# Patient Record
Sex: Female | Born: 1960 | Race: White | Hispanic: No | Marital: Married | Smoking: Never smoker
Health system: Southern US, Community
[De-identification: ages and names within clinical notes are randomized; demographics above are authoritative.]

## PROBLEM LIST (undated history)

## (undated) HISTORY — PX: WISDOM TOOTH EXTRACTION: SHX21

## (undated) HISTORY — PX: BREAST SURGERY: SHX581

---

## 2018-10-16 ENCOUNTER — Other Ambulatory Visit: Payer: Self-pay | Admitting: Internal Medicine

## 2018-10-16 DIAGNOSIS — R1013 Epigastric pain: Secondary | ICD-10-CM

## 2018-10-16 DIAGNOSIS — D72829 Elevated white blood cell count, unspecified: Secondary | ICD-10-CM

## 2018-10-17 ENCOUNTER — Emergency Department (HOSPITAL_COMMUNITY)
Admission: EM | Admit: 2018-10-17 | Discharge: 2018-10-17 | Disposition: A | Discharge status: Home / Self Care | Payer: 59 | Attending: Emergency Medicine | Admitting: Emergency Medicine

## 2018-10-17 ENCOUNTER — Other Ambulatory Visit: Payer: Self-pay

## 2018-10-17 ENCOUNTER — Ambulatory Visit
Admission: RE | Admit: 2018-10-17 | Discharge: 2018-10-17 | Disposition: A | Discharge status: Home / Self Care | Payer: Self-pay | Source: Ambulatory Visit | Attending: Internal Medicine | Admitting: Internal Medicine

## 2018-10-17 ENCOUNTER — Encounter (HOSPITAL_COMMUNITY): Payer: Self-pay

## 2018-10-17 ENCOUNTER — Emergency Department (HOSPITAL_COMMUNITY): Payer: 59

## 2018-10-17 DIAGNOSIS — R52 Pain, unspecified: Secondary | ICD-10-CM

## 2018-10-17 DIAGNOSIS — K8020 Calculus of gallbladder without cholecystitis without obstruction: Secondary | ICD-10-CM | POA: Insufficient documentation

## 2018-10-17 DIAGNOSIS — R1013 Epigastric pain: Secondary | ICD-10-CM | POA: Diagnosis present

## 2018-10-17 DIAGNOSIS — D72829 Elevated white blood cell count, unspecified: Secondary | ICD-10-CM

## 2018-10-17 LAB — COMPREHENSIVE METABOLIC PANEL
ALT: 22 U/L (ref 0–44)
AST: 30 U/L (ref 15–41)
Albumin: 4 g/dL (ref 3.5–5.0)
Alkaline Phosphatase: 62 U/L (ref 38–126)
Anion gap: 12 (ref 5–15)
BUN: 9 mg/dL (ref 6–20)
CO2: 25 mmol/L (ref 22–32)
Calcium: 9.5 mg/dL (ref 8.9–10.3)
Chloride: 100 mmol/L (ref 98–111)
Creatinine, Ser: 0.77 mg/dL (ref 0.44–1.00)
GFR calc Af Amer: >60 mL/min (ref >60)
GFR calc non Af Amer: >60 mL/min (ref >60)
Glucose, Bld: 107 mg/dL — ABNORMAL HIGH (ref 70–99)
Potassium: 4 mmol/L (ref 3.5–5.1)
Sodium: 137 mmol/L (ref 135–145)
Total Bilirubin: 0.9 mg/dL (ref 0.3–1.2)
Total Protein: 7.4 g/dL (ref 6.5–8.1)

## 2018-10-17 LAB — LIPASE, BLOOD: Lipase: 20 U/L (ref 11–51)

## 2018-10-17 LAB — CBC
HCT: 38.7 % (ref 36.0–46.0)
Hemoglobin: 13 g/dL (ref 12.0–15.0)
MCH: 29.1 pg (ref 26.0–34.0)
MCHC: 33.6 g/dL (ref 30.0–36.0)
MCV: 86.8 fL (ref 80.0–100.0)
Platelets: 313 10*3/uL (ref 150–400)
RBC: 4.46 MIL/uL (ref 3.87–5.11)
RDW: 12.7 % (ref 11.5–15.5)
WBC: 12.4 10*3/uL — ABNORMAL HIGH (ref 4.0–10.5)
nRBC: 0 % (ref 0.0–0.2)

## 2018-10-17 IMAGING — CT CT ABDOMEN AND PELVIS WITH CONTRAST
1 of 3 series · 14 of 32 positions shown, 19 images · IV contrast (APPLIED)
Comparison: None.

CLINICAL DATA: Right upper quadrant pain, epigastric pain and
elevated white blood cell count.

EXAM:
CT ABDOMEN AND PELVIS WITH CONTRAST
TECHNIQUE: Multidetector CT imaging of the abdomen and pelvis was performed
using the standard protocol following bolus administration of
intravenous contrast.
CONTRAST:  100mL M1HM1W-BII IOPAMIDOL (M1HM1W-BII) INJECTION 61%

[Series 2: abd/pelvis w/cm · axial · 0.80mm/px · z∈[-447,-27]mm · 14 of 96 slices shown, 19 images]
[im 6/96  soft-tissue]
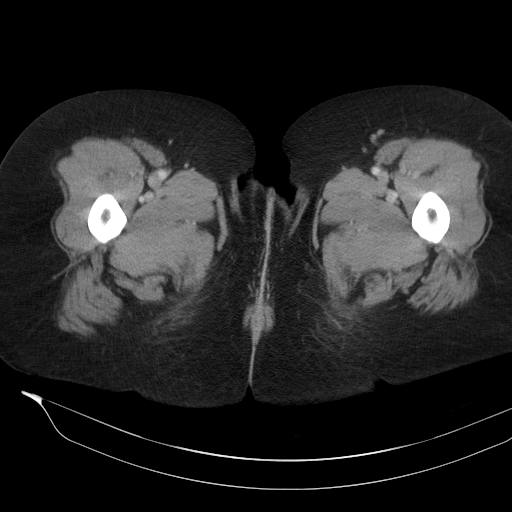
[im 6/96  bone]
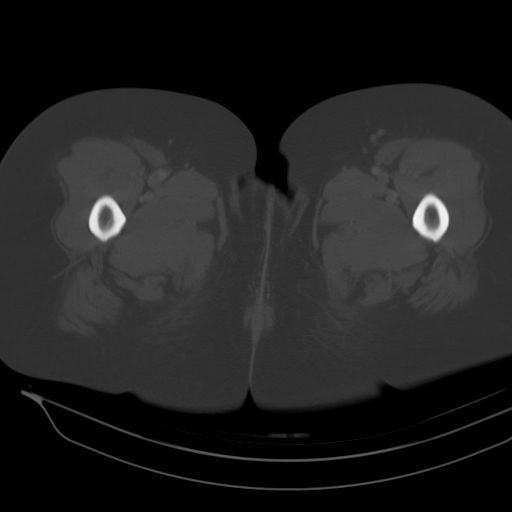
[im 12/96  soft-tissue]
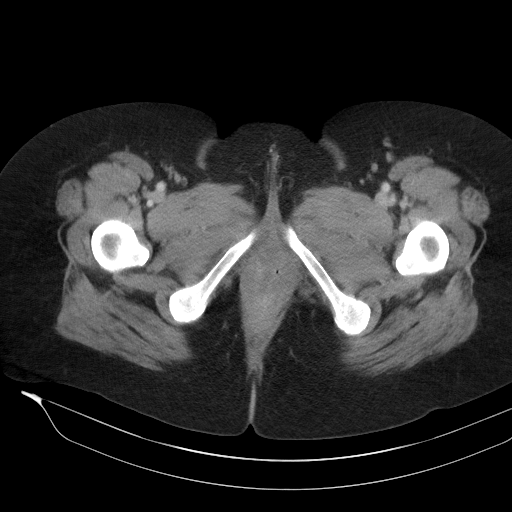
[im 18/96  soft-tissue]
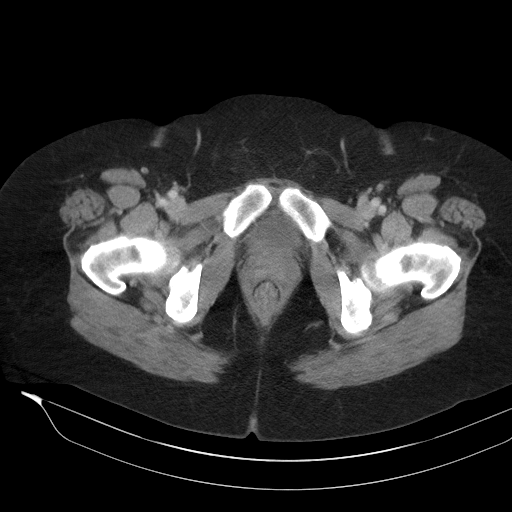
[im 30/96  soft-tissue]
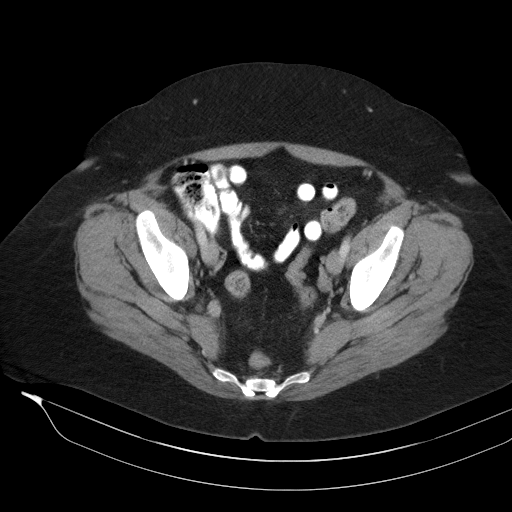
[im 36/96  soft-tissue]
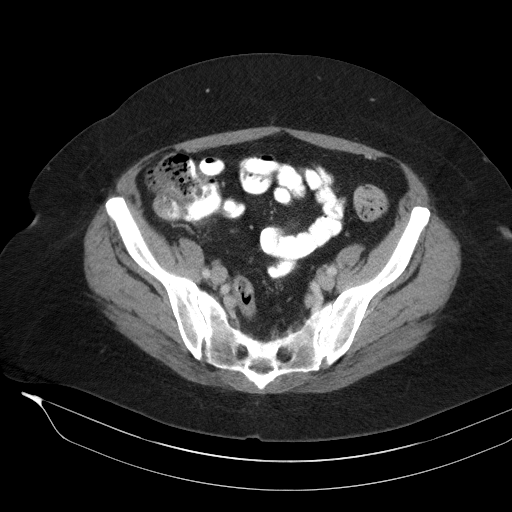
[im 42/96  soft-tissue]
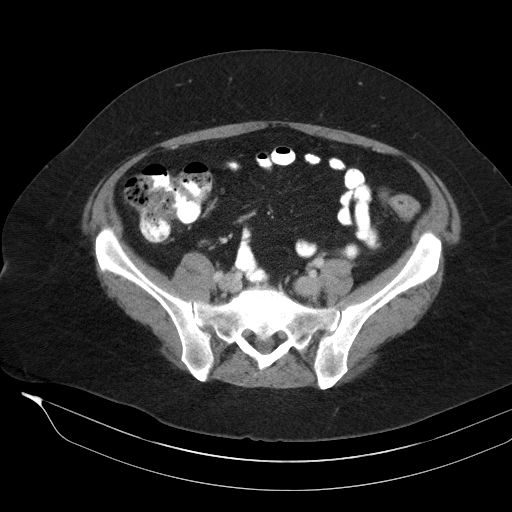
[im 48/96  soft-tissue]
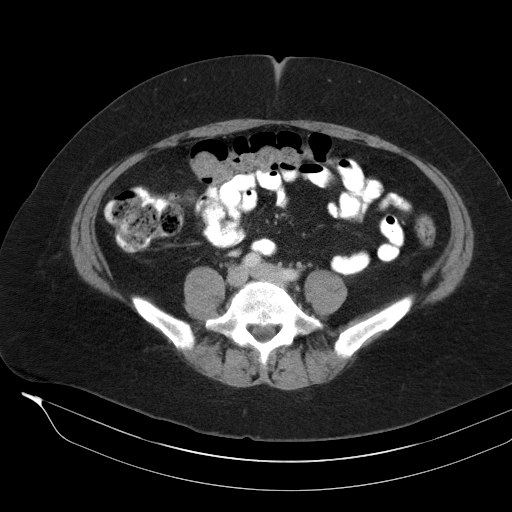
[im 54/96  soft-tissue]
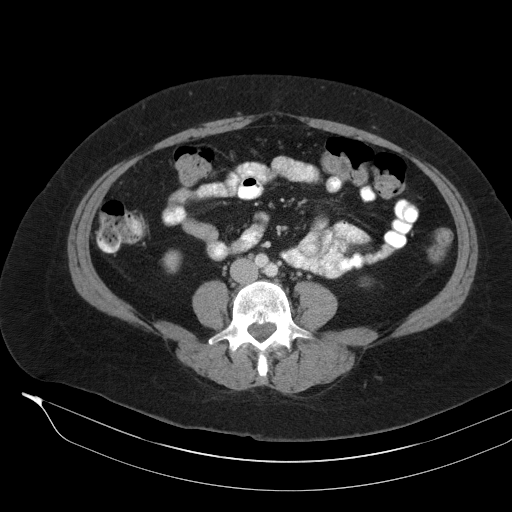
[im 60/96  soft-tissue]
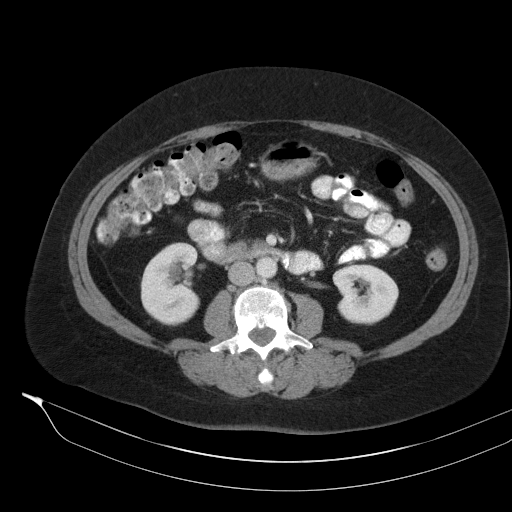
[im 60/96  bone]
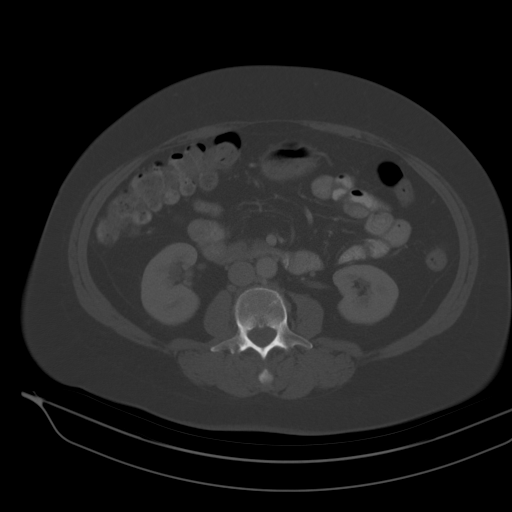
[im 66/96  soft-tissue]
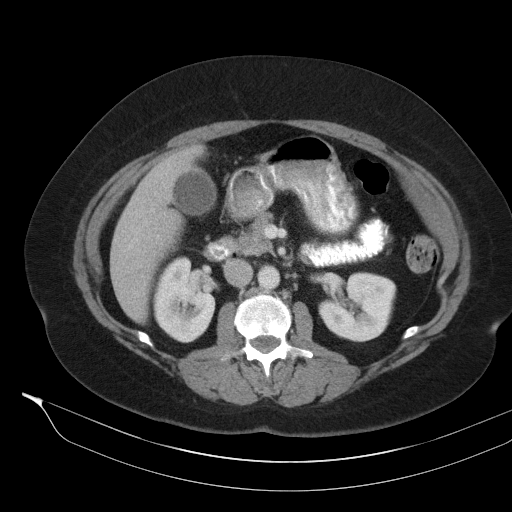
[im 72/96  lung]
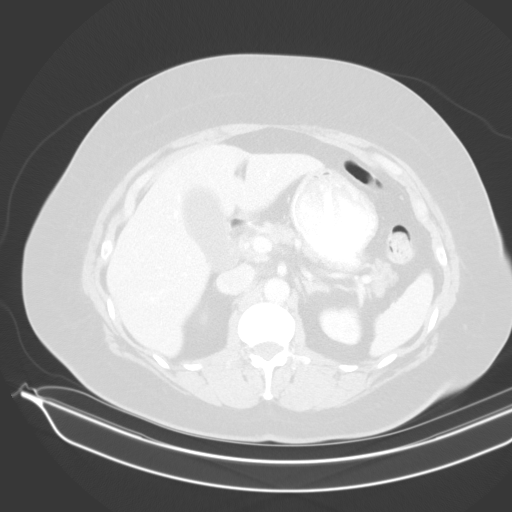
[im 78/96  soft-tissue]
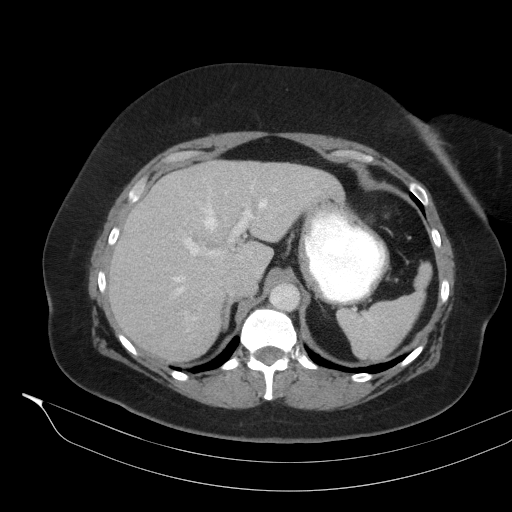
[im 78/96  lung]
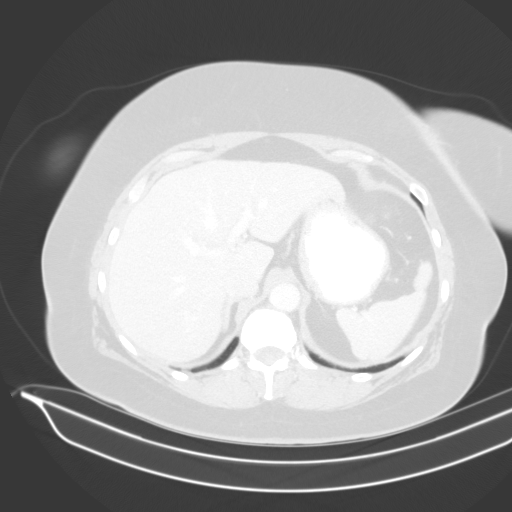
[im 84/96  soft-tissue]
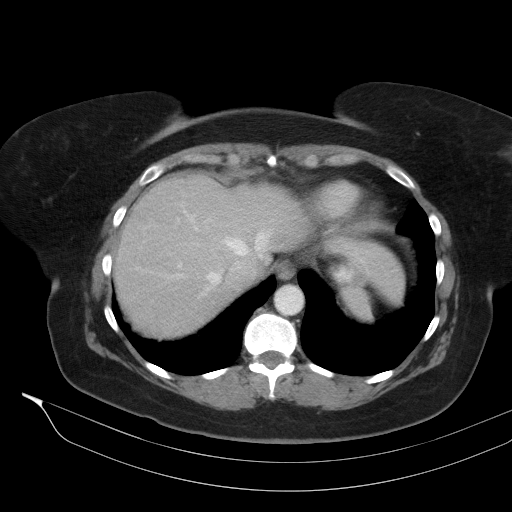
[im 84/96  lung]
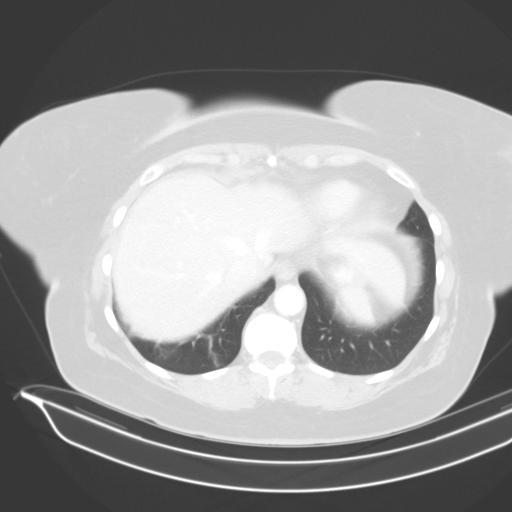
[im 90/96  soft-tissue]
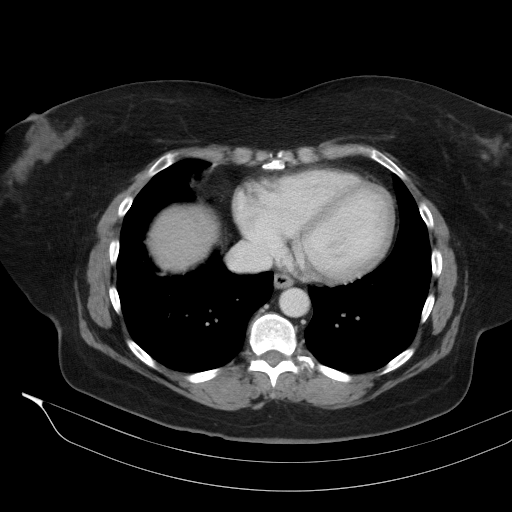
[im 90/96  lung]
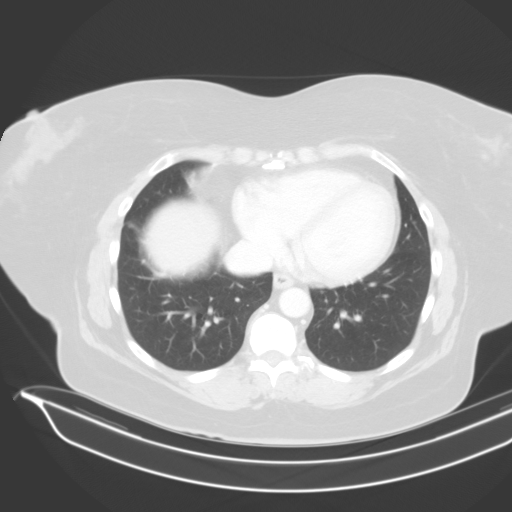

[14 of 32 positions shown; findings below may reference images not displayed]

FINDINGS: Lower chest: Right pericardial cyst. Partially visualized. No acute
abnormalities. Mild atelectatic changes of bilateral lung bases.

Hepatobiliary: Normal appearance of the liver. Apparent thickening
of the gallbladder wall. No evidence of biliary ductal dilation.

Pancreas: Unremarkable. No pancreatic ductal dilatation or
surrounding inflammatory changes.

Spleen: Normal in size without focal abnormality.

Adrenals/Urinary Tract: 1.4 cm intermediate density right adrenal
mass. Normal left adrenal. Normal appearance of the kidneys, ureters
and urinary bladder.

Stomach/Bowel: Stomach is within normal limits. No evidence of
appendicitis. No evidence of bowel wall thickening, distention, or
inflammatory changes.

Vascular/Lymphatic: No significant vascular findings are present. No
enlarged abdominal or pelvic lymph nodes.

Reproductive: Uterus and bilateral adnexa are unremarkable.

Other: No abdominal wall hernia or abnormality. No abdominopelvic
ascites.

Musculoskeletal: Mild spondylosis of the lower lumbosacral spine.
IMPRESSION: 1. Apparent thickening of the gallbladder wall. Please correlate to
patient's symptomatology and liver enzymes to assess for possible
acute cholecystitis.
2. 1.4 cm intermediate density right adrenal mass. This may
represent an adrenal adenoma, however is incompletely characterized
on this single phase study.
3. No evidence of acute abnormalities within the solid abdominal
organs.
4. Right pericardial cyst.
5. Mild spondylosis of the lower lumbosacral spine.

## 2018-10-17 IMAGING — US ULTRASOUND ABDOMEN LIMITED
1 series · 14 of 21 positions shown · non-contrast
Comparison: Abdomen CT obtained earlier today.

CLINICAL DATA: Gallbladder wall thickening on an abdomen CT earlier
today. Right upper quadrant and epigastric abdominal pain and
leukocytosis.

EXAM:
ULTRASOUND ABDOMEN LIMITED RIGHT UPPER QUADRANT

[Series 1: ultrasound abdomen limited · 14 of 21 slices shown]
[im 1/21]
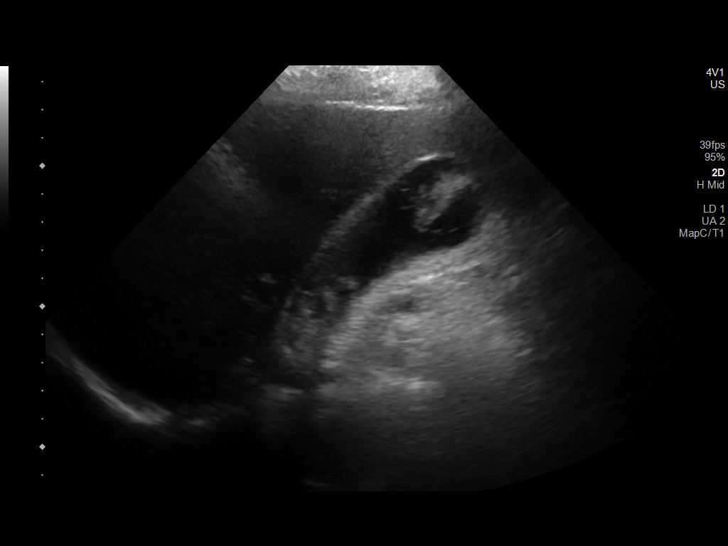
[im 3/21]
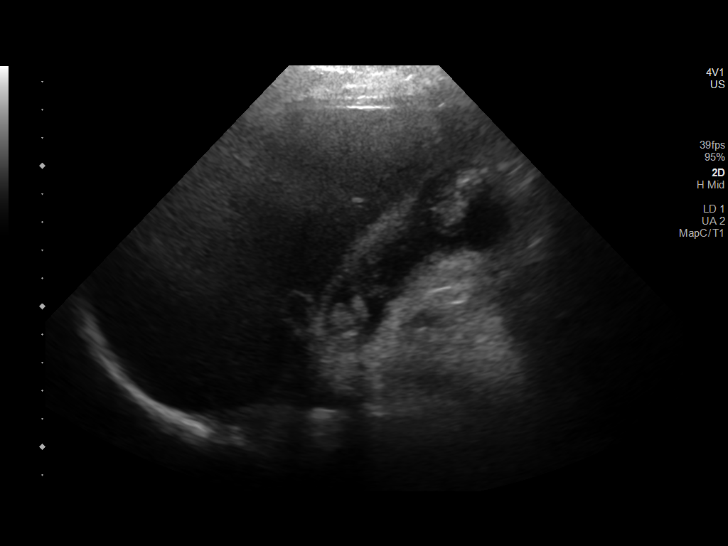
[im 4/21]
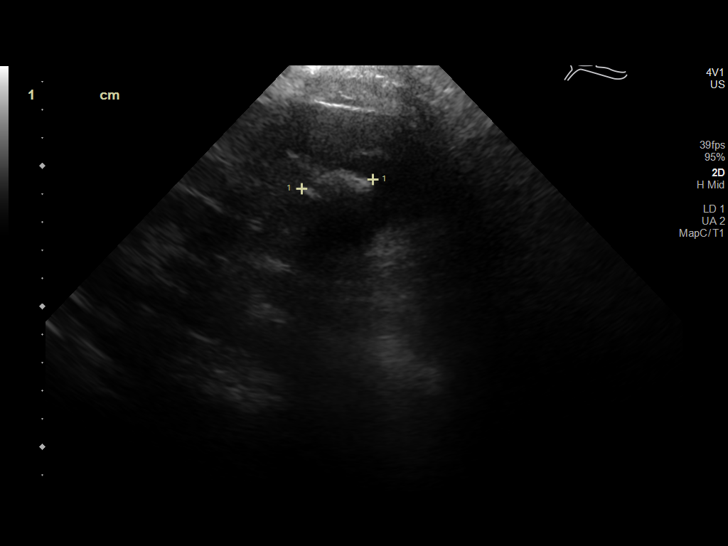
[im 6/21]
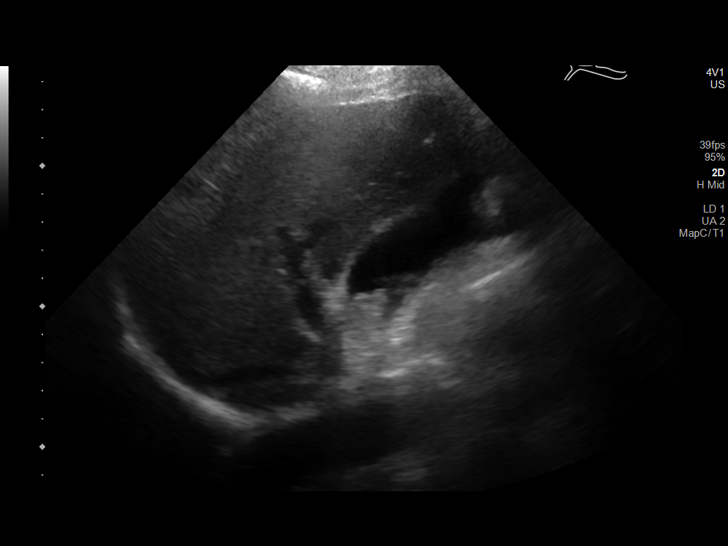
[im 7/21]
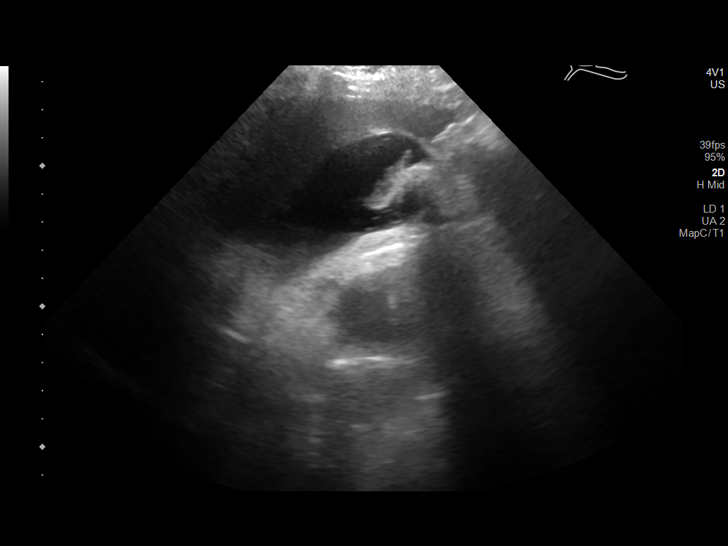
[im 9/21]
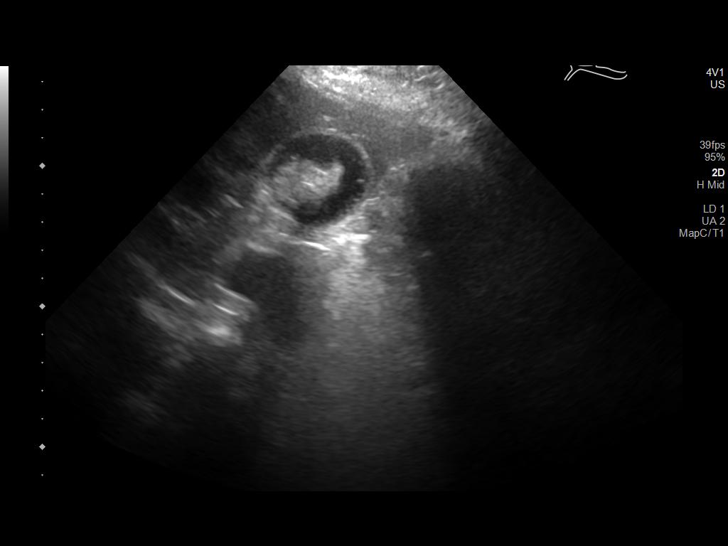
[im 10/21]
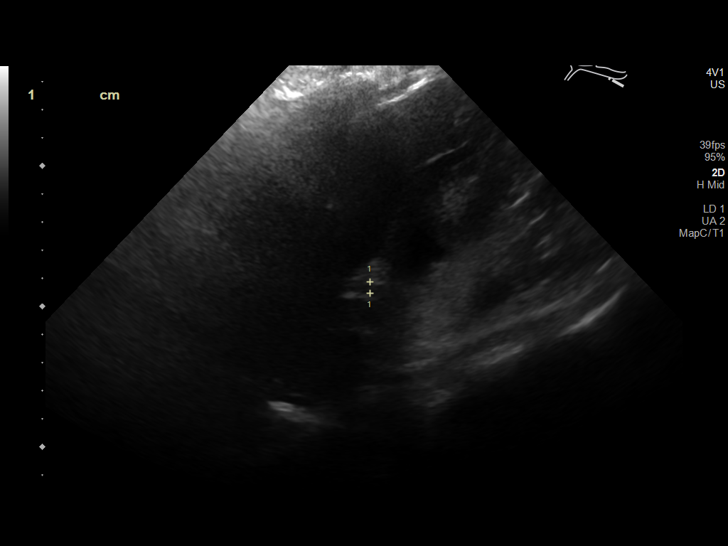
[im 12/21]
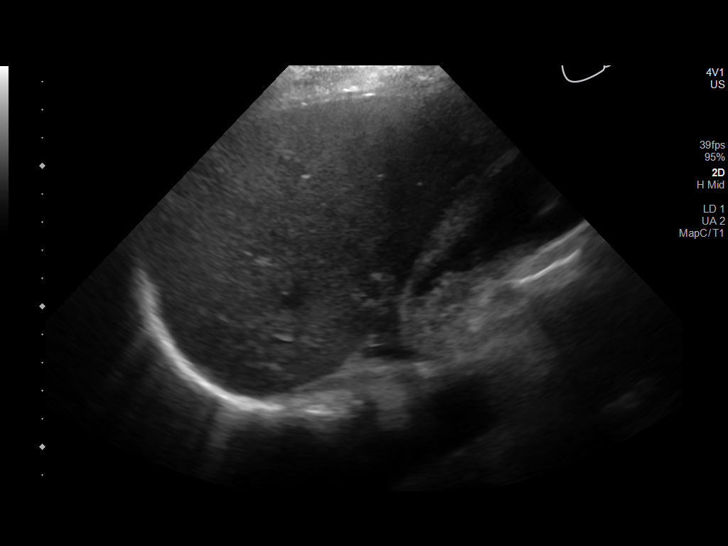
[im 13/21]
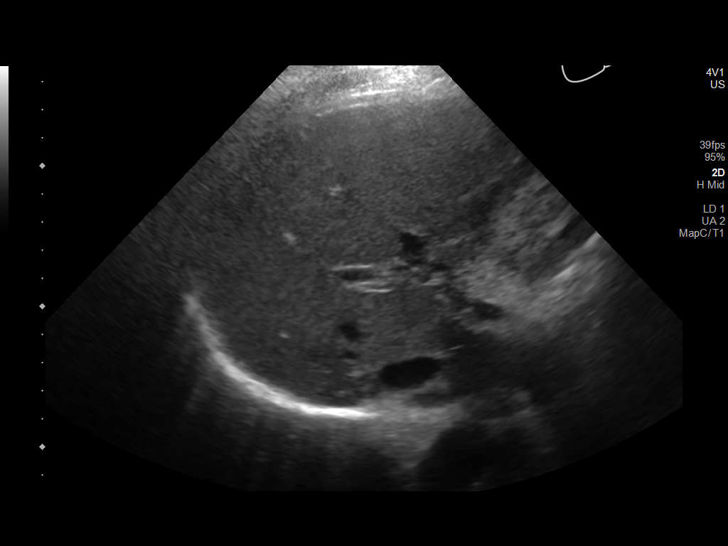
[im 15/21]
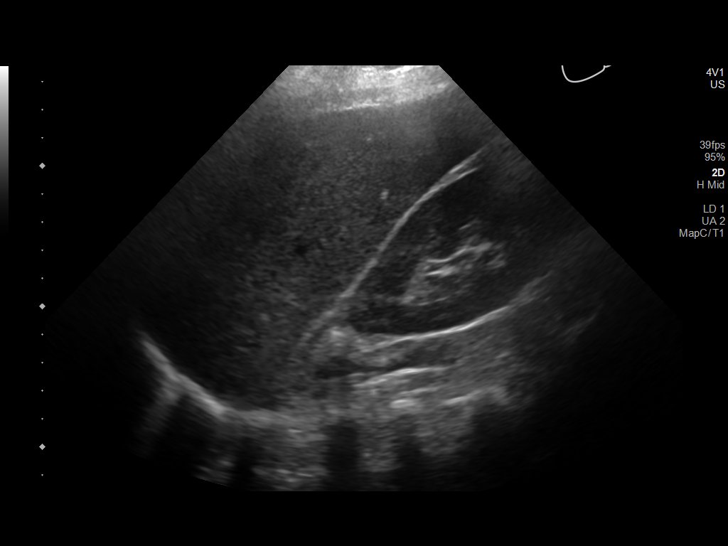
[im 16/21]
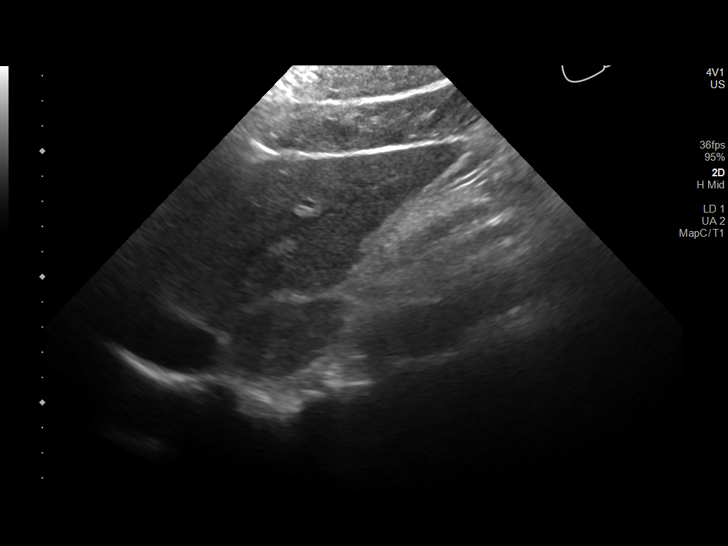
[im 18/21]
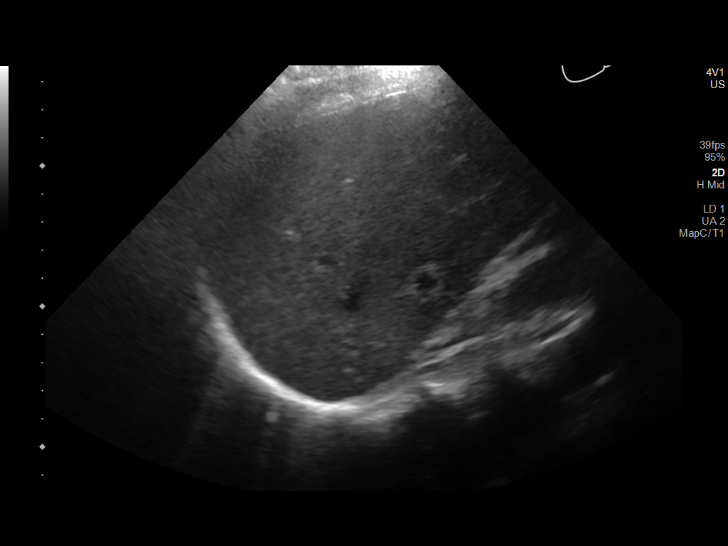
[im 19/21]
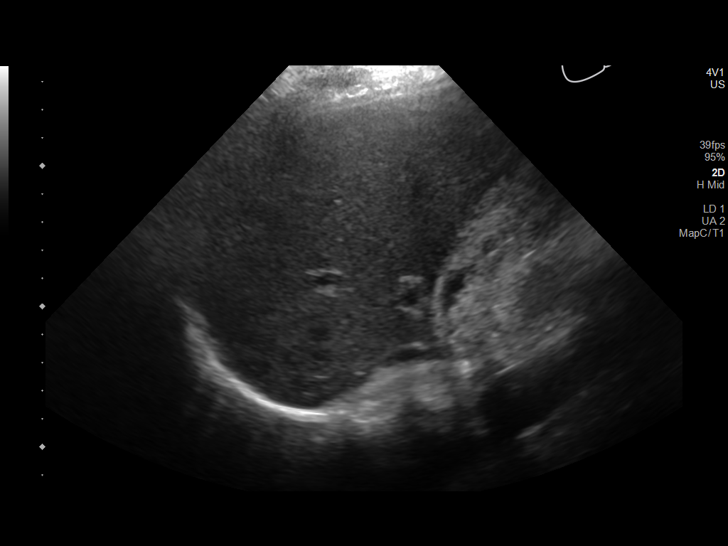
[im 21/21]
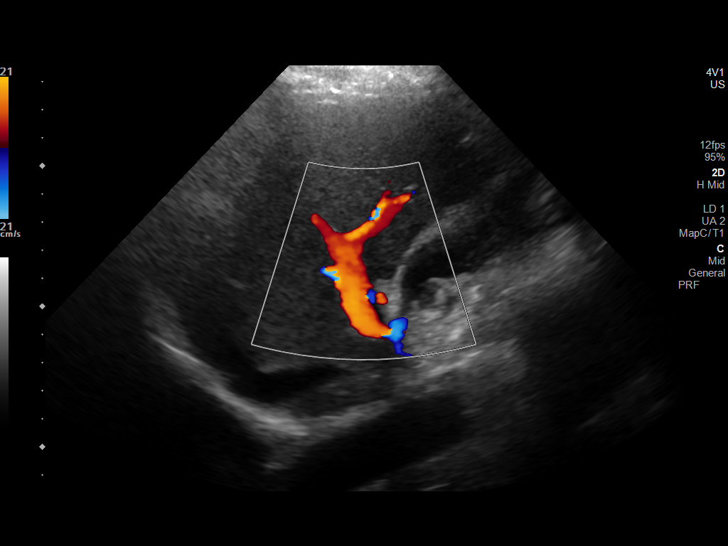

[14 of 21 positions shown; findings below may reference images not displayed]

FINDINGS: Gallbladder:

Multiple gallstones in the gallbladder measuring up to 2.6 cm in
maximum diameter each. Mild gallbladder wall thickening with a
maximum thickness of 4.5 mm. No pericholecystic fluid. No
sonographic Murphy sign.

Common bile duct:

Diameter: 4.0 mm

Liver:

No focal lesion identified. Within normal limits in parenchymal
echogenicity. Portal vein is patent on color Doppler imaging with
normal direction of blood flow towards the liver.

Other: None.
IMPRESSION: 1. Cholelithiasis.
2. Mild diffuse gallbladder wall thickening with no pericholecystic
fluid and no sonographic Murphy sign. This combination is most
compatible with chronic cholecystitis. Early changes of acute
cholecystitis are less likely.

## 2018-10-17 MED ORDER — IOPAMIDOL (ISOVUE-300) INJECTION 61%
100.0000 mL | Freq: Once | INTRAVENOUS | Status: AC | PRN
  Administered 2018-10-17: 100 mL via INTRAVENOUS

## 2018-10-17 MED ORDER — SODIUM CHLORIDE 0.9% FLUSH
3.0000 mL | Freq: Once | INTRAVENOUS | Status: AC
  Administered 2018-10-17: 3 mL via INTRAVENOUS

## 2018-10-17 NOTE — Discharge Instructions (Signed)
If you develop fevers, your symptoms worsen or you have any concerns please seek additional medical care and evaluation.

## 2018-10-17 NOTE — ED Triage Notes (Signed)
Patient here to be evaluated by [facility] [place] [facility] for gallbladder disease, reports that it showed on CT. Patient reports 2 days of epigastric pain and vomiting

## 2018-10-17 NOTE — ED Notes (Addendum)
ED Provider at bedside. 

## 2018-10-17 NOTE — ED Provider Notes (Signed)
[facility] EMERGENCY DEPARTMENT Provider Note   CSN: [id number] Arrival date & time: 10/17/18  1718    History   Chief Complaint Chief Complaint  Patient presents with  . gallbladder/ here for surgeon    HPI Carol Simon is a 58 y.o. female with no significant past medical history who presents today for evaluation of epigastric pain.  2 days ago she had sudden onset of severe epigastric pain with nausea and vomiting.  She went to her primary care doctor today for this despite her symptoms improving.  She had a outpatient CT scan showing concern for cholecystitis based on wall thickening with recommendation to correlate with labs and clinical picture.  She reports that her pain has significantly improved without specific intervention.  She denies any fevers.       HPI  History reviewed. No pertinent past medical history.  There are no active problems to display for this patient.   History reviewed. No pertinent surgical history.   OB History   No obstetric history on file.      Home Medications    Prior to Admission medications   Not on File    Family History No family history on file.  Social History Social History   Tobacco Use  . Smoking status: Not on file  Substance Use Topics  . Alcohol use: Not on file  . Drug use: Not on file     Allergies   Sulfa antibiotics   Review of Systems Review of Systems  Constitutional: Negative for chills and fever.  Respiratory: Negative for chest tightness and shortness of breath.   Cardiovascular: Negative for chest pain, palpitations and leg swelling.  Gastrointestinal: Positive for abdominal pain and nausea. Negative for diarrhea and vomiting (Resolved ).  Genitourinary: Negative for dysuria.  Musculoskeletal: Negative for back pain and neck pain.  Neurological: Negative for weakness and headaches.  Psychiatric/Behavioral: Negative for confusion.  All other systems reviewed and are  negative.    Physical Exam Updated Vital Signs BP (!) 153/85   Pulse 70   Temp 99.1 F (37.3 C) (Oral)   Resp 19   SpO2 98%   Physical Exam Vitals signs and nursing note reviewed.  Constitutional:      General: She is not in acute distress.    Appearance: She is well-developed. She is not diaphoretic.  HENT:     Head: Normocephalic and atraumatic.     Mouth/Throat:     Mouth: Mucous membranes are moist.  Eyes:     General: No scleral icterus.       Right eye: No discharge.        Left eye: No discharge.     Conjunctiva/sclera: Conjunctivae normal.  Neck:     Musculoskeletal: Normal range of motion.  Cardiovascular:     Rate and Rhythm: Normal rate and regular rhythm.     Pulses: Normal pulses.     Heart sounds: Normal heart sounds.  Pulmonary:     Effort: Pulmonary effort is normal. No respiratory distress.     Breath sounds: No stridor.  Abdominal:     General: Abdomen is flat. There is no distension.     Palpations: There is no mass.     Tenderness: There is abdominal tenderness (Mild epigastric). There is no guarding. Negative signs include Murphy's sign and McBurney's sign.     Comments: No specific RUQ TTP.   Musculoskeletal:        General: No deformity.  Right lower leg: No edema.     Left lower leg: No edema.  Skin:    General: Skin is warm and dry.  Neurological:     Mental Status: She is alert.     Motor: No abnormal muscle tone.  Psychiatric:        Behavior: Behavior normal.      ED Treatments / Results  Labs (all labs ordered are listed, but only abnormal results are displayed) Labs Reviewed  COMPREHENSIVE METABOLIC PANEL - Abnormal; Notable for the following components:      Result Value   Glucose, Bld 107 (*)    All other components within normal limits  CBC - Abnormal; Notable for the following components:   WBC 12.4 (*)    All other components within normal limits  LIPASE, BLOOD    EKG EKG Interpretation  Date/Time:   Wednesday October 17 2018 19:26:56 EDT Ventricular Rate:  71 PR Interval:    QRS Duration: 94 QT Interval:  434 QTC Calculation: 472 R Axis:   85 Text Interpretation:  Sinus rhythm Borderline repolarization abnormality No old tracing to compare Confirmed by Deno Etienne [phone]) on 10/17/2018 9:17:52 PM   Radiology- CT scan obtained PTA Ct Abdomen Pelvis W Contrast  Result Date: 10/17/2018 CLINICAL DATA:  Right upper quadrant pain, epigastric pain and elevated white blood cell count. EXAM: CT ABDOMEN AND PELVIS WITH CONTRAST TECHNIQUE: Multidetector CT imaging of the abdomen and pelvis was performed using the standard protocol following bolus administration of intravenous contrast. CONTRAST:  131m ISOVUE-300 IOPAMIDOL (ISOVUE-300) INJECTION 61% COMPARISON:  None. FINDINGS: Lower chest: Right pericardial cyst. Partially visualized. No acute abnormalities. Mild atelectatic changes of bilateral lung bases. Hepatobiliary: Normal appearance of the liver. Apparent thickening of the gallbladder wall. No evidence of biliary ductal dilation. Pancreas: Unremarkable. No pancreatic ductal dilatation or surrounding inflammatory changes. Spleen: Normal in size without focal abnormality. Adrenals/Urinary Tract: 1.4 cm intermediate density right adrenal mass. Normal left adrenal. Normal appearance of the kidneys, ureters and urinary bladder. Stomach/Bowel: Stomach is within normal limits. No evidence of appendicitis. No evidence of bowel wall thickening, distention, or inflammatory changes. Vascular/Lymphatic: No significant vascular findings are present. No enlarged abdominal or pelvic lymph nodes. Reproductive: Uterus and bilateral adnexa are unremarkable. Other: No abdominal wall hernia or abnormality. No abdominopelvic ascites. Musculoskeletal: Mild spondylosis of the lower lumbosacral spine. IMPRESSION: 1. Apparent thickening of the gallbladder wall. Please correlate to patient's symptomatology and liver enzymes to  assess for possible acute cholecystitis. 2. 1.4 cm intermediate density right adrenal mass. This may represent an adrenal adenoma, however is incompletely characterized on this single phase study. 3. No evidence of acute abnormalities within the solid abdominal organs. 4. Right pericardial cyst. 5. Mild spondylosis of the lower lumbosacral spine. Electronically Signed   By: DFidela SalisburyM.D.   On: 10/17/2018 14:07   [place] Ruq  Result Date: 10/17/2018 CLINICAL DATA:  Gallbladder wall thickening on an abdomen CT earlier today. Right upper quadrant and epigastric abdominal pain and leukocytosis. EXAM: ULTRASOUND ABDOMEN LIMITED RIGHT UPPER QUADRANT COMPARISON:  Abdomen CT obtained earlier today. FINDINGS: Gallbladder: Multiple gallstones in the gallbladder measuring up to 2.6 cm in maximum diameter each. Mild gallbladder wall thickening with a maximum thickness of 4.5 mm. No pericholecystic fluid. No sonographic Murphy sign. Common bile duct: Diameter: 4.0 mm Liver: No focal lesion identified. Within normal limits in parenchymal echogenicity. Portal vein is patent on color Doppler imaging with normal direction of blood flow towards the  liver. Other: None. IMPRESSION: 1. Cholelithiasis. 2. Mild diffuse gallbladder wall thickening with no pericholecystic fluid and no sonographic Murphy sign. This combination is most compatible with chronic cholecystitis. Early changes of acute cholecystitis are less likely. Electronically Signed   By: Claudie Revering M.D.   On: 10/17/2018 20:46    Procedures Procedures (including critical care time)  Medications Ordered in ED Medications  sodium chloride flush (NS) 0.9 % injection 3 mL (3 mLs Intravenous Given 10/17/18 1828)     Initial Impression / Assessment and Plan / ED Course  I have reviewed the triage vital signs and the nursing notes.  Pertinent labs & imaging results that were available during my care of the patient were reviewed by me and  considered in my medical decision making (see chart for details).  Clinical Course as of Oct 17 2310  Wed Oct 17, 2018  2142 Spoke with Dr. Brantley Stage who recommends outpatient follow-up.   [EH]    Clinical Course User Index [EH] Lorin Glass, PA-C      Patient presents today for concern of cholecystitis.  She had a sudden onset of pain however that has significantly improved over the past day.  On exam she does not have specific right upper quadrant abdominal tenderness, rather most of her pain is epigastric.  She has been started on Carafate, pantoprazole by her primary care doctor.  Her CT scan obtained earlier showed gallbladder wall thickening.  Given her absence of pain in that area ultrasound was obtained, negative sonographic Murphy's.  There is cholelithiasis and general wall thickening there is no pericolic fluid or other evidence of acute cholecystitis.  Her white count is slightly elevated at 12.4, however given normal AST, ALT, alk phos and total bili reassuring that this may be reactive rather than infectious.  I spoke with Dr. Brantley Stage on call for general surgery who recommended outpatient follow-up.  EKG was obtained which was unremarkable.  We did discuss the incidental findings that were found on her CT scan that was obtained prior to arrival and the need to follow-up on those.  I suspect that her symptoms, especially the epigastric location of the pain, are more related to gastritis rather than cholecystitis.  Return precautions were discussed with patient who states their understanding.  At the time of discharge patient denied any unaddressed complaints or concerns.  Patient is agreeable for discharge home.   Final Clinical Impressions(s) / ED Diagnoses   Final diagnoses:  Pain  Calculus of gallbladder without cholecystitis without obstruction    ED Discharge Orders    None       Lorin Glass, PA-C 10/17/18 [place], DO 10/17/18 2319

## 2018-10-17 NOTE — ED Notes (Signed)
Patient transported to Ultrasound 

## 2018-10-30 ENCOUNTER — Ambulatory Visit: Payer: Self-pay | Admitting: Surgery

## 2018-10-30 NOTE — H&P (Signed)
History of Present Illness Carol Simon. Carol Saez MD; 10/30/2018 4:44 PM) The patient is a 58 year old female who presents for evaluation of gall stones. Referred by Dr. Shon Baton for symptomatic gallstones  This is a healthy 58 year old female who presents with 2 recent episodes of severe epigastric pain that radiated through to her back. Both of these episodes were associated with Carol Simon high-fat meals. These episodes last for several hours and were suctioned with nausea and vomiting. No diarrhea or fever. She was evaluated by her PCP who obtained a CT scan. CT on 10/17/18 showed diffuse thickening of the gallbladder wall. Her liver function tests were normal. Subsequent ultrasound showed cholelithiasis with stones measuring up to 2.6 cm but no pericholecystic fluid. The patient has been on a very strict diet and has had no symptoms since that time. She is now referred to [place] for surgical evaluation. Her sister had a similar surgery with [place] a few years ago.  CLINICAL DATA: Right upper quadrant pain, epigastric pain and elevated white blood cell count.  EXAM: CT ABDOMEN AND PELVIS WITH CONTRAST  TECHNIQUE: Multidetector CT imaging of the abdomen and pelvis was performed using the standard protocol following bolus administration of intravenous contrast.  CONTRAST: 133mL ISOVUE-300 IOPAMIDOL (ISOVUE-300) INJECTION 61%  COMPARISON: None.  FINDINGS: Lower chest: Right pericardial cyst. Partially visualized. No acute abnormalities. Mild atelectatic changes of bilateral lung bases.  Hepatobiliary: Normal appearance of the liver. Apparent thickening of the gallbladder wall. No evidence of biliary ductal dilation.  Pancreas: Unremarkable. No pancreatic ductal dilatation or surrounding inflammatory changes.  Spleen: Normal in size without focal abnormality.  Adrenals/Urinary Tract: 1.4 cm intermediate density right adrenal mass. Normal left adrenal. Normal appearance of the kidneys,  ureters and urinary bladder.  Stomach/Bowel: Stomach is within normal limits. No evidence of appendicitis. No evidence of bowel wall thickening, distention, or inflammatory changes.  Vascular/Lymphatic: No significant vascular findings are present. No enlarged abdominal or pelvic lymph nodes.  Reproductive: Uterus and bilateral adnexa are unremarkable.  Other: No abdominal wall hernia or abnormality. No abdominopelvic ascites.  Musculoskeletal: Mild spondylosis of the lower lumbosacral spine.  IMPRESSION: 1. Apparent thickening of the gallbladder wall. Please correlate to patient's symptomatology and liver enzymes to assess for possible acute cholecystitis. 2. 1.4 cm intermediate density right adrenal mass. This may represent an adrenal adenoma, however is incompletely characterized on this single phase study. 3. No evidence of acute abnormalities within the solid abdominal organs. 4. Right pericardial cyst. 5. Mild spondylosis of the lower lumbosacral spine.   Electronically Signed By: Fidela Salisbury M.D. On: 10/17/2018 14:07  CLINICAL DATA: Gallbladder wall thickening on an abdomen CT earlier today. Right upper quadrant and epigastric abdominal pain and leukocytosis.  EXAM: ULTRASOUND ABDOMEN LIMITED RIGHT UPPER QUADRANT  COMPARISON: Abdomen CT obtained earlier today.  FINDINGS: Gallbladder:  Multiple gallstones in the gallbladder measuring up to 2.6 cm in maximum diameter each. Mild gallbladder wall thickening with a maximum thickness of 4.5 mm. No pericholecystic fluid. No sonographic Murphy sign.  Common bile duct:  Diameter: 4.0 mm  Liver:  No focal lesion identified. Within normal limits in parenchymal echogenicity. Portal vein is patent on color Doppler imaging with normal direction of blood flow towards the liver.  Other: None.  IMPRESSION: 1. Cholelithiasis. 2. Mild diffuse gallbladder wall thickening with no pericholecystic fluid  and no sonographic Murphy sign. This combination is most compatible with chronic cholecystitis. Early changes of acute cholecystitis are less likely.   Electronically Signed By: Claudie Revering  M.D. On: 10/17/2018 20:46   Problem List/Past Medical Rodman Key K. Ameliya Nicotra, MD; 10/30/2018 4:44 PM) CHRONIC CHOLECYSTITIS WITH CALCULUS (K80.10)  Diagnostic Studies History Emeline Gins, [place]; 10/30/2018 2:45 PM) Colonoscopy 1-5 years ago Mammogram within last year Pap Smear 1-5 years ago  Allergies Emeline Gins, [place]; 10/30/2018 2:46 PM) Sulfa Antibiotics Allergies Reconciled  Medication History Emeline Gins, [place]; 10/30/2018 2:46 PM) Medications Reconciled  Social History Emeline Gins, [place]; 10/30/2018 2:45 PM) No drug use  Family History Emeline Gins, [place]; 10/30/2018 2:45 PM) Arthritis Mother. Cancer Mother.  Pregnancy / Birth History Emeline Gins, [place]; 10/30/2018 2:45 PM) Age at menarche 41 years. Age of menopause 23-55 Gravida 0  Other Problems Carol Simon. Kaevon Cotta, MD; 10/30/2018 4:44 PM) Cholelithiasis Lump In Breast     Review of Systems Emeline Gins CMA; 10/30/2018 2:45 PM) General Not Present- Appetite Loss, Chills, Fatigue, Fever, Night Sweats, Weight Gain and Weight Loss. Skin Not Present- Change in Wart/Mole, Dryness, Hives, Jaundice, New Lesions, Non-Healing Wounds, Rash and Ulcer. HEENT Not Present- Earache, Hearing Loss, Hoarseness, Nose Bleed, Oral Ulcers, Ringing in the Ears, Seasonal Allergies, Sinus Pain, Sore Throat, Visual Disturbances, Wears glasses/contact lenses and Yellow Eyes. Respiratory Not Present- Bloody sputum, Chronic Cough, Difficulty Breathing, Snoring and Wheezing. Breast Not Present- Breast Mass, Breast Pain, Nipple Discharge and Skin Changes. Cardiovascular Not Present- Chest Pain, Difficulty Breathing Lying Down, Leg Cramps, Palpitations, Rapid Heart Rate, Shortness of Breath and Swelling of  Extremities. Gastrointestinal Not Present- Abdominal Pain, Bloating, Bloody Stool, Change in Bowel Habits, Chronic diarrhea, Constipation, Difficulty Swallowing, Excessive gas, Gets full quickly at meals, Hemorrhoids, Indigestion, Nausea, Rectal Pain and Vomiting. Female Genitourinary Not Present- Frequency, Nocturia, Painful Urination, Pelvic Pain and Urgency. Musculoskeletal Not Present- Back Pain, Joint Pain, Joint Stiffness, Muscle Pain, Muscle Weakness and Swelling of Extremities. Neurological Not Present- Decreased Memory, Fainting, Headaches, Numbness, Seizures, Tingling, Tremor, Trouble walking and Weakness. Psychiatric Not Present- Anxiety, Bipolar, Change in Sleep Pattern, Depression, Fearful and Frequent crying. Endocrine Not Present- Cold Intolerance, Excessive Hunger, Hair Changes, Heat Intolerance, Hot flashes and New Diabetes. Hematology Not Present- Blood Thinners, Easy Bruising, Excessive bleeding, Gland problems, HIV and Persistent Infections.  Vitals Emeline Gins CMA; 10/30/2018 2:46 PM) 10/30/2018 2:46 PM Weight: 220 lb Height: 75in Body Surface Area: 2.29 m Body Mass Index: 27.5 kg/m  Temp.: 98.49F  Pulse: 87 (Regular)  BP: 152/82 (Sitting, Left Arm, Standard)        Physical Exam Rodman Key K. Ned Kakar MD; 10/30/2018 4:44 PM)  The physical exam findings are as follows: Note:WDWN in NAD Eyes: Pupils equal, round; sclera anicteric HENT: Oral mucosa moist; good dentition Neck: No masses palpated, no thyromegaly Lungs: CTA bilaterally; normal respiratory effort CV: Regular rate and rhythm; no murmurs; extremities well-perfused with no edema Abd: +bowel sounds, soft, non-tender, no palpable organomegaly; no palpable hernias Skin: Warm, dry; no sign of jaundice Psychiatric - alert and oriented x 4; calm mood and affect    Assessment & Plan Rodman Key K. Edgerrin Correia MD; 10/30/2018 3:20 PM)  CHRONIC CHOLECYSTITIS WITH CALCULUS (K80.10)  Current Plans Schedule  for Surgery - Laparoscopic cholecystectomy with intraoperative cholangiogram. The surgical procedure has been discussed with the patient. Potential risks, benefits, alternative treatments, and expected outcomes have been explained. All of the patient's questions at this time have been answered. The likelihood of reaching the patient's treatment goal is good. The patient understand the proposed surgical procedure and wishes to proceed.  Carol Simon. Georgette Dover, MD, [facility]  General/ Trauma Surgery Beeper [phone]  10/30/2018 4:44 PM

## 2018-10-30 NOTE — H&P (View-Only) (Signed)
History of Present Illness Carol Simon. Carol Macrae MD; 10/30/2018 4:44 PM) The patient is a 58 year old female who presents for evaluation of gall stones. Referred by Dr. Shon Baton for symptomatic gallstones  This is a healthy 58 year old female who presents with 2 recent episodes of severe epigastric pain that radiated through to her back. Both of these episodes were associated with Carol Simon high-fat meals. These episodes last for several hours and were suctioned with nausea and vomiting. No diarrhea or fever. She was evaluated by her PCP who obtained a CT scan. CT on 10/17/18 showed diffuse thickening of the gallbladder wall. Her liver function tests were normal. Subsequent ultrasound showed cholelithiasis with stones measuring up to 2.6 cm but no pericholecystic fluid. The patient has been on a very strict diet and has had no symptoms since that time. She is now referred to [place] for surgical evaluation. Her sister had a similar surgery with [place] a few years ago.  CLINICAL DATA: Right upper quadrant pain, epigastric pain and elevated white blood cell count.  EXAM: CT ABDOMEN AND PELVIS WITH CONTRAST  TECHNIQUE: Multidetector CT imaging of the abdomen and pelvis was performed using the standard protocol following bolus administration of intravenous contrast.  CONTRAST: 132mL ISOVUE-300 IOPAMIDOL (ISOVUE-300) INJECTION 61%  COMPARISON: None.  FINDINGS: Lower chest: Right pericardial cyst. Partially visualized. No acute abnormalities. Mild atelectatic changes of bilateral lung bases.  Hepatobiliary: Normal appearance of the liver. Apparent thickening of the gallbladder wall. No evidence of biliary ductal dilation.  Pancreas: Unremarkable. No pancreatic ductal dilatation or surrounding inflammatory changes.  Spleen: Normal in size without focal abnormality.  Adrenals/Urinary Tract: 1.4 cm intermediate density right adrenal mass. Normal left adrenal. Normal appearance of the kidneys,  ureters and urinary bladder.  Stomach/Bowel: Stomach is within normal limits. No evidence of appendicitis. No evidence of bowel wall thickening, distention, or inflammatory changes.  Vascular/Lymphatic: No significant vascular findings are present. No enlarged abdominal or pelvic lymph nodes.  Reproductive: Uterus and bilateral adnexa are unremarkable.  Other: No abdominal wall hernia or abnormality. No abdominopelvic ascites.  Musculoskeletal: Mild spondylosis of the lower lumbosacral spine.  IMPRESSION: 1. Apparent thickening of the gallbladder wall. Please correlate to patient's symptomatology and liver enzymes to assess for possible acute cholecystitis. 2. 1.4 cm intermediate density right adrenal mass. This may represent an adrenal adenoma, however is incompletely characterized on this single phase study. 3. No evidence of acute abnormalities within the solid abdominal organs. 4. Right pericardial cyst. 5. Mild spondylosis of the lower lumbosacral spine.   Electronically Signed By: Fidela Salisbury M.D. On: 10/17/2018 14:07  CLINICAL DATA: Gallbladder wall thickening on an abdomen CT earlier today. Right upper quadrant and epigastric abdominal pain and leukocytosis.  EXAM: ULTRASOUND ABDOMEN LIMITED RIGHT UPPER QUADRANT  COMPARISON: Abdomen CT obtained earlier today.  FINDINGS: Gallbladder:  Multiple gallstones in the gallbladder measuring up to 2.6 cm in maximum diameter each. Mild gallbladder wall thickening with a maximum thickness of 4.5 mm. No pericholecystic fluid. No sonographic Murphy sign.  Common bile duct:  Diameter: 4.0 mm  Liver:  No focal lesion identified. Within normal limits in parenchymal echogenicity. Portal vein is patent on color Doppler imaging with normal direction of blood flow towards the liver.  Other: None.  IMPRESSION: 1. Cholelithiasis. 2. Mild diffuse gallbladder wall thickening with no pericholecystic fluid  and no sonographic Murphy sign. This combination is most compatible with chronic cholecystitis. Early changes of acute cholecystitis are less likely.   Electronically Signed By: Claudie Revering  M.D. On: 10/17/2018 20:46   Problem List/Past Medical Rodman Key K. Shanessa Hodak, MD; 10/30/2018 4:44 PM) CHRONIC CHOLECYSTITIS WITH CALCULUS (K80.10)  Diagnostic Studies History Emeline Gins, [place]; 10/30/2018 2:45 PM) Colonoscopy 1-5 years ago Mammogram within last year Pap Smear 1-5 years ago  Allergies Emeline Gins, [place]; 10/30/2018 2:46 PM) Sulfa Antibiotics Allergies Reconciled  Medication History Emeline Gins, [place]; 10/30/2018 2:46 PM) Medications Reconciled  Social History Emeline Gins, [place]; 10/30/2018 2:45 PM) No drug use  Family History Emeline Gins, [place]; 10/30/2018 2:45 PM) Arthritis Mother. Cancer Mother.  Pregnancy / Birth History Emeline Gins, [place]; 10/30/2018 2:45 PM) Age at menarche 51 years. Age of menopause 110-55 Gravida 0  Other Problems Carol Simon. Merisa Julio, MD; 10/30/2018 4:44 PM) Cholelithiasis Lump In Breast     Review of Systems Emeline Gins CMA; 10/30/2018 2:45 PM) General Not Present- Appetite Loss, Chills, Fatigue, Fever, Night Sweats, Weight Gain and Weight Loss. Skin Not Present- Change in Wart/Mole, Dryness, Hives, Jaundice, New Lesions, Non-Healing Wounds, Rash and Ulcer. HEENT Not Present- Earache, Hearing Loss, Hoarseness, Nose Bleed, Oral Ulcers, Ringing in the Ears, Seasonal Allergies, Sinus Pain, Sore Throat, Visual Disturbances, Wears glasses/contact lenses and Yellow Eyes. Respiratory Not Present- Bloody sputum, Chronic Cough, Difficulty Breathing, Snoring and Wheezing. Breast Not Present- Breast Mass, Breast Pain, Nipple Discharge and Skin Changes. Cardiovascular Not Present- Chest Pain, Difficulty Breathing Lying Down, Leg Cramps, Palpitations, Rapid Heart Rate, Shortness of Breath and Swelling of  Extremities. Gastrointestinal Not Present- Abdominal Pain, Bloating, Bloody Stool, Change in Bowel Habits, Chronic diarrhea, Constipation, Difficulty Swallowing, Excessive gas, Gets full quickly at meals, Hemorrhoids, Indigestion, Nausea, Rectal Pain and Vomiting. Female Genitourinary Not Present- Frequency, Nocturia, Painful Urination, Pelvic Pain and Urgency. Musculoskeletal Not Present- Back Pain, Joint Pain, Joint Stiffness, Muscle Pain, Muscle Weakness and Swelling of Extremities. Neurological Not Present- Decreased Memory, Fainting, Headaches, Numbness, Seizures, Tingling, Tremor, Trouble walking and Weakness. Psychiatric Not Present- Anxiety, Bipolar, Change in Sleep Pattern, Depression, Fearful and Frequent crying. Endocrine Not Present- Cold Intolerance, Excessive Hunger, Hair Changes, Heat Intolerance, Hot flashes and New Diabetes. Hematology Not Present- Blood Thinners, Easy Bruising, Excessive bleeding, Gland problems, HIV and Persistent Infections.  Vitals Emeline Gins CMA; 10/30/2018 2:46 PM) 10/30/2018 2:46 PM Weight: 220 lb Height: 75in Body Surface Area: 2.29 m Body Mass Index: 27.5 kg/m  Temp.: 98.41F  Pulse: 87 (Regular)  BP: 152/82 (Sitting, Left Arm, Standard)        Physical Exam Rodman Key K. Kleber Crean MD; 10/30/2018 4:44 PM)  The physical exam findings are as follows: Note:WDWN in NAD Eyes: Pupils equal, round; sclera anicteric HENT: Oral mucosa moist; good dentition Neck: No masses palpated, no thyromegaly Lungs: CTA bilaterally; normal respiratory effort CV: Regular rate and rhythm; no murmurs; extremities well-perfused with no edema Abd: +bowel sounds, soft, non-tender, no palpable organomegaly; no palpable hernias Skin: Warm, dry; no sign of jaundice Psychiatric - alert and oriented x 4; calm mood and affect    Assessment & Plan Rodman Key K. Monic Engelmann MD; 10/30/2018 3:20 PM)  CHRONIC CHOLECYSTITIS WITH CALCULUS (K80.10)  Current Plans Schedule  for Surgery - Laparoscopic cholecystectomy with intraoperative cholangiogram. The surgical procedure has been discussed with the patient. Potential risks, benefits, alternative treatments, and expected outcomes have been explained. All of the patient's questions at this time have been answered. The likelihood of reaching the patient's treatment goal is good. The patient understand the proposed surgical procedure and wishes to proceed.  Carol Simon. Georgette Dover, MD, [facility]  General/ Trauma Surgery Beeper [phone]  10/30/2018 4:44 PM

## 2018-11-08 NOTE — Pre-Procedure Instructions (Addendum)
[id number] - [place], [place] - [address] [place] [address] Phone: [phone] Fax: [phone]      Your procedure is scheduled on 11-15-18  Report to [facility] Main Entrance "A" at 0530 A.M., and check in at the Admitting office.  Call this number if you have problems the morning of surgery:  [phone]  Call [phone] if you have any questions prior to your surgery date Monday-Friday 8am-4pm    Remember:  Do not eat  after midnight the night before your surgery  You may drink clear liquids until 0430 AM the morning of your surgery.   Clear liquids allowed are: Water, Non-Citrus Juices (without pulp), Carbonated Beverages, Clear Tea, Black Coffee Only, and Gatorade    Take these medicines the morning of surgery with A SIP OF WATER :none  7 days prior to surgery STOP taking any Aspirin (unless otherwise instructed by your surgeon), Aleve, Naproxen, Ibuprofen, Motrin, Advil, Goody's, BC's, all herbal medications, fish oil, and all vitamins.    The Morning of Surgery  Do not wear jewelry, make-up or nail polish.  Do not wear lotions, powders, or perfumes/colognes, or deodorant  Do not shave 48 hours prior to surgery.    Do not bring valuables to the hospital.  [facility] is not responsible for any belongings or valuables.  If you are a smoker, DO NOT Smoke 24 hours prior to surgery IF you wear a CPAP at night please bring your mask, tubing, and machine the morning of surgery   Remember that you must have someone to transport you home after your surgery, and remain with you for 24 hours if you are discharged the same day.   Contacts, glasses, hearing aids, dentures or bridgework may not be worn into surgery.    Leave your suitcase in the car.  After surgery it may be brought to your room.  For patients admitted to the hospital, discharge time will be determined by your treatment team.  Patients discharged the day of surgery  will not be allowed to drive home.    Special instructions:   [place]- Preparing For Surgery  Before surgery, you can play an important role. Because skin is not sterile, your skin needs to be as free of germs as possible. You can reduce the number of germs on your skin by washing with CHG (chlorahexidine gluconate) Soap before surgery.  CHG is an antiseptic cleaner which kills germs and bonds with the skin to continue killing germs even after washing.    Oral Hygiene is also important to reduce your risk of infection.  Remember - BRUSH YOUR TEETH THE MORNING OF SURGERY WITH YOUR REGULAR TOOTHPASTE  Please do not use if you have an allergy to CHG or antibacterial soaps. If your skin becomes reddened/irritated stop using the CHG.  Do not shave (including legs and underarms) for at least 48 hours prior to first CHG shower. It is OK to shave your face.  Please follow these instructions carefully.   1. Shower the NIGHT BEFORE SURGERY and the MORNING OF SURGERY with CHG Soap.   2. If you chose to wash your hair, wash your hair first as usual with your normal shampoo.  3. After you shampoo, rinse your hair and body thoroughly to remove the shampoo.  4. Use CHG as you would any other liquid soap. You can apply CHG directly to the skin and wash gently with a scrungie or a clean washcloth.  5. Apply the CHG Soap to your body ONLY FROM THE NECK DOWN.  Do not use on open wounds or open sores. Avoid contact with your eyes, ears, mouth and genitals (private parts). Wash Face and genitals (private parts)  with your normal soap.   6. Wash thoroughly, paying special attention to the area where your surgery will be performed.  7. Thoroughly rinse your body with warm water from the neck down.  8. DO NOT shower/wash with your normal soap after using and rinsing off the CHG Soap.  9. Pat yourself dry with a CLEAN TOWEL.  10. Wear CLEAN PAJAMAS to bed the night before surgery, wear comfortable  clothes the morning of surgery  11. Place CLEAN SHEETS on your bed the night of your first shower and DO NOT SLEEP WITH PETS.   Day of Surgery:  Do not apply any deodorants/lotions. Please shower the morning of surgery with the CHG soap  Please wear clean clothes to the hospital/surgery center.   Remember to brush your teeth WITH YOUR REGULAR TOOTHPASTE.   Please read over the fact sheets that you were given.

## 2018-11-09 ENCOUNTER — Encounter (HOSPITAL_COMMUNITY)
Admission: RE | Admit: 2018-11-09 | Discharge: 2018-11-09 | Disposition: A | Discharge status: Home / Self Care | Payer: 59 | Source: Ambulatory Visit | Attending: Surgery | Admitting: Surgery

## 2018-11-09 ENCOUNTER — Other Ambulatory Visit: Payer: Self-pay

## 2018-11-09 ENCOUNTER — Encounter (HOSPITAL_COMMUNITY): Payer: Self-pay

## 2018-11-09 DIAGNOSIS — Z20828 Contact with and (suspected) exposure to other viral communicable diseases: Secondary | ICD-10-CM | POA: Insufficient documentation

## 2018-11-09 DIAGNOSIS — Z01812 Encounter for preprocedural laboratory examination: Secondary | ICD-10-CM | POA: Diagnosis not present

## 2018-11-09 LAB — CBC
HCT: 35.4 % — ABNORMAL LOW (ref 36.0–46.0)
Hemoglobin: 11.9 g/dL — ABNORMAL LOW (ref 12.0–15.0)
MCH: 29.5 pg (ref 26.0–34.0)
MCHC: 33.6 g/dL (ref 30.0–36.0)
MCV: 87.8 fL (ref 80.0–100.0)
Platelets: 312 10*3/uL (ref 150–400)
RBC: 4.03 MIL/uL (ref 3.87–5.11)
RDW: 12.2 % (ref 11.5–15.5)
WBC: 6.8 10*3/uL (ref 4.0–10.5)
nRBC: 0 % (ref 0.0–0.2)

## 2018-11-09 NOTE — Progress Notes (Addendum)
  Coronavirus Screening Scheduled-11/12/18 Have you experienced the following symptoms:  Cough yes/no: No Fever (>100.71F)  yes/no: No Runny nose yes/no: No Sore throat yes/no: No Difficulty breathing/shortness of breath  yes/no: No Loss of smell or taste-No Have you or a family member traveled in the last 14 days and where? yes/no: No  PCP - Dr Shon Baton  Cardiologist - denies  Chest x-ray - NA  EKG - 10-17-18  Stress Test - denies  ECHO - denies  Cardiac Cath - denies  AICD-denies PM-denies LOOP-denies  Sleep Study - denies CPAP - NA  LABS-CBC  ASA-denies  ERAS-clear liquids  HA1C-denies Fasting Blood Sugar -  Checks Blood Sugar 0_____ times a day  Anesthesia-N  Pt denies having chest pain, sob, or fever at this time. All instructions explained to the pt, with a verbal understanding of the material. Pt agrees to go over the instructions while at home for a better understanding. Pt also instructed to self quarantine after being tested for COVID-19. The opportunity to ask questions was provided.

## 2018-11-12 ENCOUNTER — Other Ambulatory Visit (HOSPITAL_COMMUNITY)
Admission: RE | Admit: 2018-11-12 | Discharge: 2018-11-12 | Disposition: A | Discharge status: Home / Self Care | Payer: 59 | Source: Ambulatory Visit | Attending: Surgery | Admitting: Surgery

## 2018-11-12 DIAGNOSIS — Z01812 Encounter for preprocedural laboratory examination: Secondary | ICD-10-CM | POA: Diagnosis not present

## 2018-11-13 LAB — SARS CORONAVIRUS 2 (TAT 6-24 HRS): SARS Coronavirus 2: NEGATIVE

## 2018-11-14 NOTE — Anesthesia Preprocedure Evaluation (Addendum)
Anesthesia Evaluation  Patient identified by MRN, date of birth, ID band Patient awake    Reviewed: Allergy & Precautions, NPO status , Patient's Chart, lab work & pertinent test results  History of Anesthesia Complications Negative for: history of anesthetic complications  Airway Mallampati: II  TM Distance: >3 FB Neck ROM: Full    Dental no notable dental hx.    Pulmonary neg pulmonary ROS,    Pulmonary exam normal        Cardiovascular negative cardio ROS Normal cardiovascular exam     Neuro/Psych negative neurological ROS     GI/Hepatic Neg liver ROS, Cholelithiasis   Endo/Other  Obese, BMI 35  Renal/GU negative Renal ROS     Musculoskeletal negative musculoskeletal ROS (+)   Abdominal   Peds  Hematology negative hematology ROS (+)   Anesthesia Other Findings Day of surgery medications reviewed with the patient.  Reproductive/Obstetrics                            Anesthesia Physical Anesthesia Plan  ASA: II  Anesthesia Plan: General   Post-op Pain Management:    Induction: Intravenous  PONV Risk Score and Plan: 4 or greater and Treatment may vary due to age or medical condition, Ondansetron, Dexamethasone and Midazolam  Airway Management Planned: Oral ETT  Additional Equipment:   Intra-op Plan:   Post-operative Plan: Extubation in OR  Informed Consent: I have reviewed the patients History and Physical, chart, labs and discussed the procedure including the risks, benefits and alternatives for the proposed anesthesia with the patient or authorized representative who has indicated his/her understanding and acceptance.     Dental advisory given  Plan Discussed with: CRNA  Anesthesia Plan Comments:        Anesthesia Quick Evaluation

## 2018-11-15 ENCOUNTER — Encounter (HOSPITAL_COMMUNITY)
Admission: RE | Disposition: A | Discharge status: Home / Self Care | Payer: Self-pay | Source: Home / Self Care | Attending: Surgery

## 2018-11-15 ENCOUNTER — Ambulatory Visit (HOSPITAL_COMMUNITY)
Admission: RE | Admit: 2018-11-15 | Discharge: 2018-11-15 | Disposition: A | Discharge status: Home / Self Care | Payer: 59 | Attending: Surgery | Admitting: Surgery

## 2018-11-15 ENCOUNTER — Ambulatory Visit (HOSPITAL_COMMUNITY): Payer: 59 | Admitting: Anesthesiology

## 2018-11-15 ENCOUNTER — Other Ambulatory Visit: Payer: Self-pay

## 2018-11-15 ENCOUNTER — Encounter (HOSPITAL_COMMUNITY): Payer: Self-pay | Admitting: *Deleted

## 2018-11-15 ENCOUNTER — Ambulatory Visit (HOSPITAL_COMMUNITY): Payer: 59 | Admitting: Vascular Surgery

## 2018-11-15 ENCOUNTER — Ambulatory Visit (HOSPITAL_COMMUNITY): Payer: 59

## 2018-11-15 DIAGNOSIS — Z6835 Body mass index (BMI) 35.0-35.9, adult: Secondary | ICD-10-CM | POA: Insufficient documentation

## 2018-11-15 DIAGNOSIS — Z881 Allergy status to other antibiotic agents status: Secondary | ICD-10-CM | POA: Insufficient documentation

## 2018-11-15 DIAGNOSIS — Q248 Other specified congenital malformations of heart: Secondary | ICD-10-CM | POA: Insufficient documentation

## 2018-11-15 DIAGNOSIS — E279 Disorder of adrenal gland, unspecified: Secondary | ICD-10-CM | POA: Diagnosis not present

## 2018-11-15 DIAGNOSIS — Z419 Encounter for procedure for purposes other than remedying health state, unspecified: Secondary | ICD-10-CM

## 2018-11-15 DIAGNOSIS — K8010 Calculus of gallbladder with chronic cholecystitis without obstruction: Secondary | ICD-10-CM | POA: Insufficient documentation

## 2018-11-15 DIAGNOSIS — E669 Obesity, unspecified: Secondary | ICD-10-CM | POA: Insufficient documentation

## 2018-11-15 HISTORY — PX: CHOLECYSTECTOMY: SHX55

## 2018-11-15 IMAGING — RF DG CHOLANGIOGRAM OPERATIVE
1 series · 4 of 4 positions shown · non-contrast
Comparison: CT and ultrasound studies on 10/17/2018

CLINICAL DATA: Cholecystectomy for cholelithiasis and
cholecystitis.

EXAM:
INTRAOPERATIVE CHOLANGIOGRAM
TECHNIQUE: Cholangiographic images from the C-arm fluoroscopic device were
submitted for interpretation post-operatively. Please see the
procedural report for the amount of contrast and the fluoroscopy
time utilized.

[Series 1: unknown protocol · 0.20mm/px · 4 of 46 frames shown]
[frame 7/46]
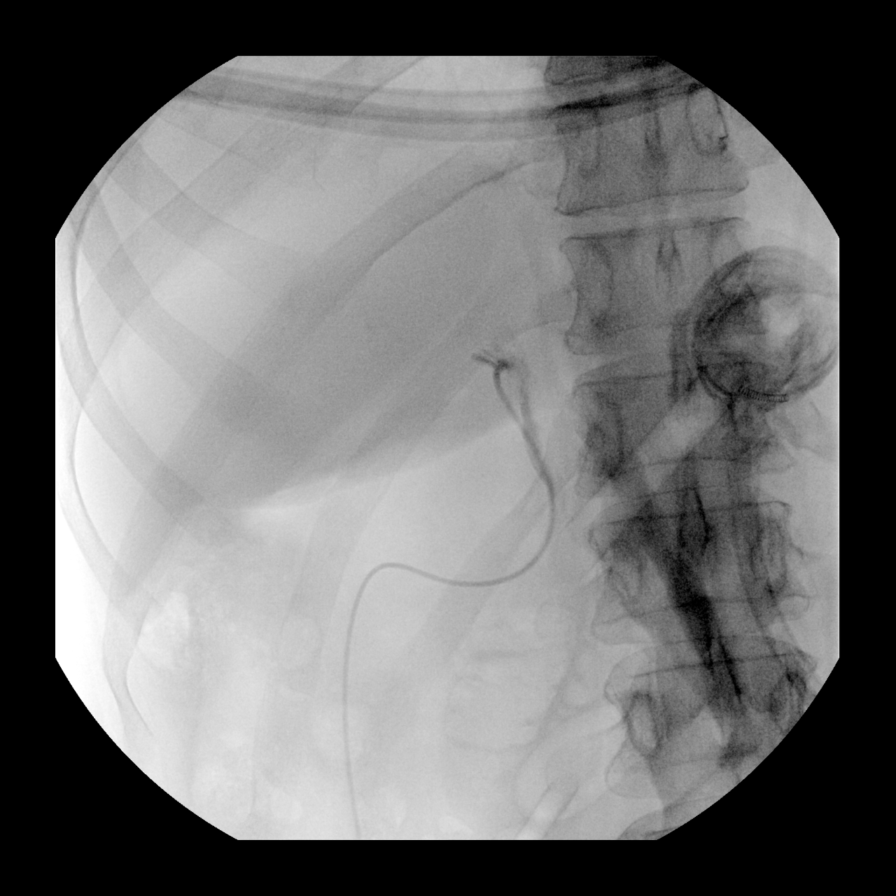
[frame 8/46]
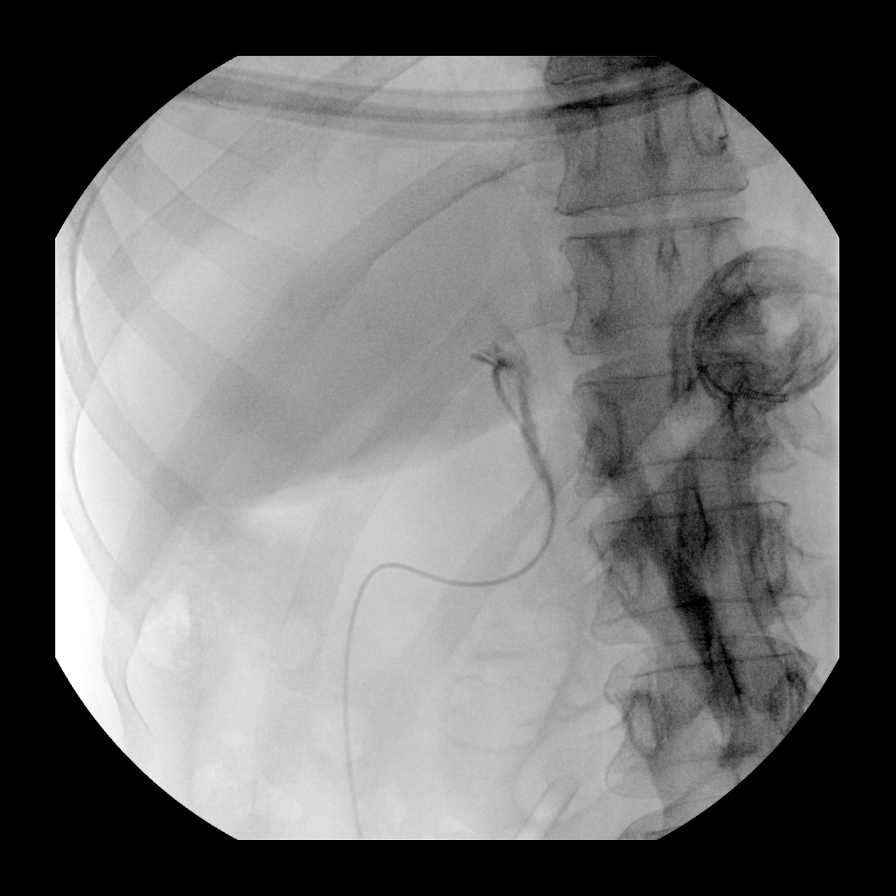
[frame 24/46]
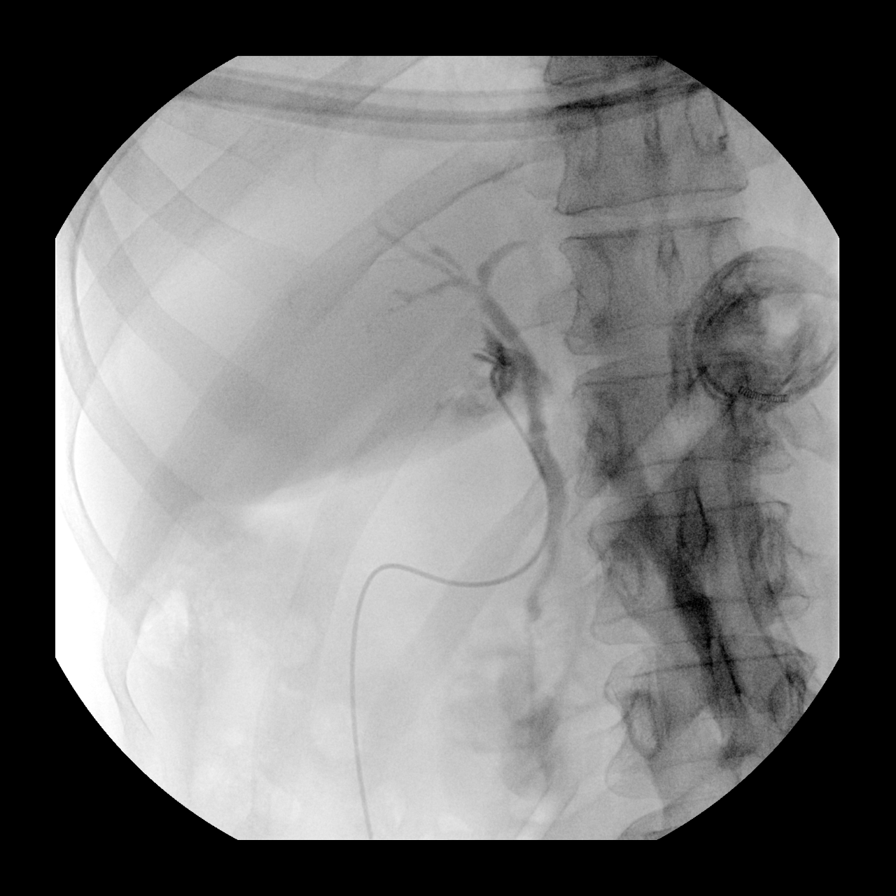
[frame 40/46]
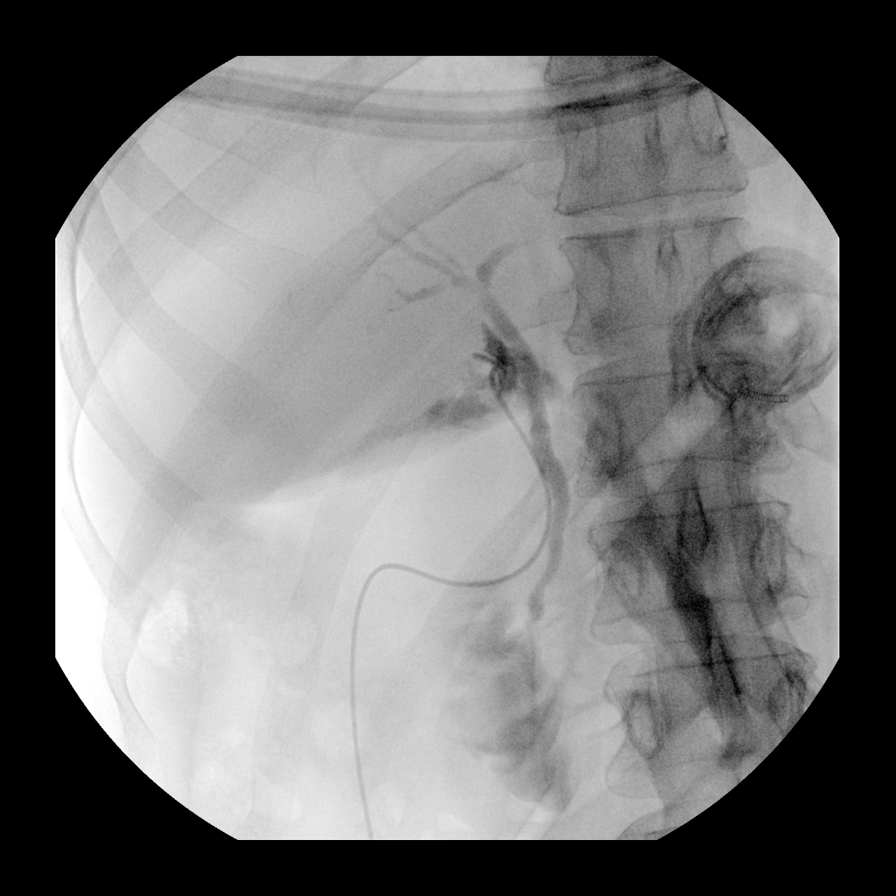

[4 of 4 positions shown; findings below may reference images not displayed]

FINDINGS: Intraoperative imaging with a C-arm demonstrates normal opacified
bile ducts without evidence of biliary dilatation, filling defects
or obstruction. Contrast enters the duodenum normally. There is a
mild amount of contrast extravasation at the injection site.
IMPRESSION: Unremarkable intraoperative cholangiogram.

## 2018-11-15 SURGERY — LAPAROSCOPIC CHOLECYSTECTOMY WITH INTRAOPERATIVE CHOLANGIOGRAM
Anesthesia: General | Site: Abdomen

## 2018-11-15 MED ORDER — GABAPENTIN 300 MG PO CAPS
ORAL_CAPSULE | ORAL | Status: AC
  Administered 2018-11-15: 300 mg via ORAL
  Filled 2018-11-15: qty 1

## 2018-11-15 MED ORDER — BUPIVACAINE-EPINEPHRINE 0.25% -1:200000 IJ SOLN
INTRAMUSCULAR | Status: DC | PRN
  Administered 2018-11-15: 13 mL

## 2018-11-15 MED ORDER — ONDANSETRON HCL 4 MG/2ML IJ SOLN
INTRAMUSCULAR | Status: AC
  Filled 2018-11-15: qty 2

## 2018-11-15 MED ORDER — SUGAMMADEX SODIUM 200 MG/2ML IV SOLN
INTRAVENOUS | Status: DC | PRN
  Administered 2018-11-15: 200 mg via INTRAVENOUS

## 2018-11-15 MED ORDER — CHLORHEXIDINE GLUCONATE CLOTH 2 % EX PADS: 6.0000 | MEDICATED_PAD | Freq: Once | CUTANEOUS | Status: DC

## 2018-11-15 MED ORDER — ROCURONIUM BROMIDE 10 MG/ML (PF) SYRINGE
PREFILLED_SYRINGE | INTRAVENOUS | Status: AC
  Filled 2018-11-15: qty 10

## 2018-11-15 MED ORDER — DEXAMETHASONE SODIUM PHOSPHATE 10 MG/ML IJ SOLN
INTRAMUSCULAR | Status: AC
  Filled 2018-11-15: qty 1

## 2018-11-15 MED ORDER — FENTANYL CITRATE (PF) 250 MCG/5ML IJ SOLN
INTRAMUSCULAR | Status: AC
  Filled 2018-11-15: qty 5

## 2018-11-15 MED ORDER — HEMOSTATIC AGENTS (NO CHARGE) OPTIME
TOPICAL | Status: DC | PRN
  Administered 2018-11-15 (×2): 1 via TOPICAL

## 2018-11-15 MED ORDER — LIDOCAINE 2% (20 MG/ML) 5 ML SYRINGE
INTRAMUSCULAR | Status: AC
  Filled 2018-11-15: qty 5

## 2018-11-15 MED ORDER — BUPIVACAINE-EPINEPHRINE (PF) 0.25% -1:200000 IJ SOLN
INTRAMUSCULAR | Status: AC
  Filled 2018-11-15: qty 30

## 2018-11-15 MED ORDER — PROMETHAZINE HCL 25 MG/ML IJ SOLN
6.2500 mg | INTRAMUSCULAR | Status: DC | PRN
  Administered 2018-11-15: 6.25 mg via INTRAVENOUS

## 2018-11-15 MED ORDER — ACETAMINOPHEN 500 MG PO TABS
1000.0000 mg | ORAL_TABLET | ORAL | Status: AC
  Administered 2018-11-15: 06:00:00 1000 mg via ORAL

## 2018-11-15 MED ORDER — OXYCODONE HCL 5 MG PO TABS: 5.0000 mg | ORAL_TABLET | Freq: Four times a day (QID) | ORAL | 0 refills | Status: DC | PRN

## 2018-11-15 MED ORDER — FENTANYL CITRATE (PF) 100 MCG/2ML IJ SOLN
INTRAMUSCULAR | Status: AC
  Filled 2018-11-15: qty 2

## 2018-11-15 MED ORDER — PROPOFOL 10 MG/ML IV BOLUS
INTRAVENOUS | Status: AC
  Filled 2018-11-15: qty 20

## 2018-11-15 MED ORDER — ONDANSETRON HCL 4 MG/2ML IJ SOLN
INTRAMUSCULAR | Status: DC | PRN
  Administered 2018-11-15: 4 mg via INTRAVENOUS

## 2018-11-15 MED ORDER — PHENYLEPHRINE 40 MCG/ML (10ML) SYRINGE FOR IV PUSH (FOR BLOOD PRESSURE SUPPORT)
PREFILLED_SYRINGE | INTRAVENOUS | Status: AC
  Filled 2018-11-15: qty 10

## 2018-11-15 MED ORDER — FENTANYL CITRATE (PF) 100 MCG/2ML IJ SOLN
INTRAMUSCULAR | Status: DC | PRN
  Administered 2018-11-15 (×5): 50 ug via INTRAVENOUS
  Administered 2018-11-15: 100 ug via INTRAVENOUS

## 2018-11-15 MED ORDER — OXYCODONE HCL 5 MG PO TABS
5.0000 mg | ORAL_TABLET | Freq: Once | ORAL | Status: AC | PRN
  Administered 2018-11-15: 5 mg via ORAL

## 2018-11-15 MED ORDER — DEXAMETHASONE SODIUM PHOSPHATE 10 MG/ML IJ SOLN
INTRAMUSCULAR | Status: DC | PRN
  Administered 2018-11-15: 8 mg via INTRAVENOUS

## 2018-11-15 MED ORDER — PROMETHAZINE HCL 25 MG/ML IJ SOLN
INTRAMUSCULAR | Status: AC
  Filled 2018-11-15: qty 1

## 2018-11-15 MED ORDER — MIDAZOLAM HCL 5 MG/5ML IJ SOLN
INTRAMUSCULAR | Status: DC | PRN
  Administered 2018-11-15: 2 mg via INTRAVENOUS

## 2018-11-15 MED ORDER — GABAPENTIN 300 MG PO CAPS
300.0000 mg | ORAL_CAPSULE | ORAL | Status: AC
  Administered 2018-11-15: 06:00:00 300 mg via ORAL

## 2018-11-15 MED ORDER — LIDOCAINE 2% (20 MG/ML) 5 ML SYRINGE
INTRAMUSCULAR | Status: DC | PRN
  Administered 2018-11-15: 60 mg via INTRAVENOUS
  Administered 2018-11-15: 40 mg via INTRAVENOUS

## 2018-11-15 MED ORDER — CEFAZOLIN SODIUM-DEXTROSE 2-4 GM/100ML-% IV SOLN
INTRAVENOUS | Status: AC
  Filled 2018-11-15: qty 100

## 2018-11-15 MED ORDER — LACTATED RINGERS IV SOLN
INTRAVENOUS | Status: DC | PRN
  Administered 2018-11-15 (×2): via INTRAVENOUS

## 2018-11-15 MED ORDER — OXYCODONE HCL 5 MG/5ML PO SOLN: 5.0000 mg | Freq: Once | ORAL | Status: AC | PRN

## 2018-11-15 MED ORDER — ACETAMINOPHEN 500 MG PO TABS
ORAL_TABLET | ORAL | Status: AC
  Administered 2018-11-15: 1000 mg via ORAL
  Filled 2018-11-15: qty 2

## 2018-11-15 MED ORDER — STERILE WATER FOR IRRIGATION IR SOLN
Status: DC | PRN
  Administered 2018-11-15: 1000 mL

## 2018-11-15 MED ORDER — 0.9 % SODIUM CHLORIDE (POUR BTL) OPTIME
TOPICAL | Status: DC | PRN
  Administered 2018-11-15: 08:00:00 1000 mL

## 2018-11-15 MED ORDER — SUCCINYLCHOLINE CHLORIDE 200 MG/10ML IV SOSY
PREFILLED_SYRINGE | INTRAVENOUS | Status: AC
  Filled 2018-11-15: qty 10

## 2018-11-15 MED ORDER — FENTANYL CITRATE (PF) 100 MCG/2ML IJ SOLN
25.0000 ug | INTRAMUSCULAR | Status: DC | PRN
  Administered 2018-11-15 (×4): 25 ug via INTRAVENOUS

## 2018-11-15 MED ORDER — ROCURONIUM BROMIDE 50 MG/5ML IV SOSY
PREFILLED_SYRINGE | INTRAVENOUS | Status: DC | PRN
  Administered 2018-11-15: 40 mg via INTRAVENOUS
  Administered 2018-11-15: 10 mg via INTRAVENOUS

## 2018-11-15 MED ORDER — SODIUM CHLORIDE 0.9 % IV SOLN
INTRAVENOUS | Status: DC | PRN
  Administered 2018-11-15: 6 mL

## 2018-11-15 MED ORDER — CEFAZOLIN SODIUM-DEXTROSE 2-4 GM/100ML-% IV SOLN
2.0000 g | INTRAVENOUS | Status: AC
  Administered 2018-11-15: 2 g via INTRAVENOUS

## 2018-11-15 MED ORDER — SUCCINYLCHOLINE CHLORIDE 200 MG/10ML IV SOSY
PREFILLED_SYRINGE | INTRAVENOUS | Status: DC | PRN
  Administered 2018-11-15: 120 mg via INTRAVENOUS

## 2018-11-15 MED ORDER — PROPOFOL 10 MG/ML IV BOLUS
INTRAVENOUS | Status: DC | PRN
  Administered 2018-11-15: 180 mg via INTRAVENOUS

## 2018-11-15 MED ORDER — MIDAZOLAM HCL 2 MG/2ML IJ SOLN
INTRAMUSCULAR | Status: AC
  Filled 2018-11-15: qty 2

## 2018-11-15 MED ORDER — OXYCODONE HCL 5 MG PO TABS
ORAL_TABLET | ORAL | Status: AC
  Filled 2018-11-15: qty 1

## 2018-11-15 MED ORDER — SODIUM CHLORIDE 0.9 % IR SOLN
Status: DC | PRN
  Administered 2018-11-15 (×2): 1000 mL

## 2018-11-15 SURGICAL SUPPLY — 47 items
APPLICATOR ARISTA FLEXITIP XL (MISCELLANEOUS) ×4 IMPLANT
APPLIER CLIP ROT 10 11.4 M/L (STAPLE) ×4
BENZOIN TINCTURE PRP APPL 2/3 (GAUZE/BANDAGES/DRESSINGS) ×2 IMPLANT
BLADE CLIPPER SURG (BLADE) ×2 IMPLANT
CANISTER SUCT 3000ML PPV (MISCELLANEOUS) ×2 IMPLANT
CHLORAPREP W/TINT 26 (MISCELLANEOUS) ×2 IMPLANT
CLIP APPLIE ROT 10 11.4 M/L (STAPLE) ×2 IMPLANT
COVER MAYO STAND STRL (DRAPES) ×2 IMPLANT
COVER SURGICAL LIGHT HANDLE (MISCELLANEOUS) ×2 IMPLANT
DRAPE C-ARM 42X72 X-RAY (DRAPES) ×2 IMPLANT
DRSG TEGADERM 2-3/8X2-3/4 SM (GAUZE/BANDAGES/DRESSINGS) ×2 IMPLANT
DRSG TEGADERM 4X4.75 (GAUZE/BANDAGES/DRESSINGS) ×2 IMPLANT
ELECT REM PT RETURN 9FT ADLT (ELECTROSURGICAL) ×2
ELECTRODE REM PT RTRN 9FT ADLT (ELECTROSURGICAL) ×1 IMPLANT
GAUZE SPONGE 2X2 8PLY STRL LF (GAUZE/BANDAGES/DRESSINGS) ×1 IMPLANT
GLOVE BIO SURGEON STRL SZ7 (GLOVE) ×2 IMPLANT
GLOVE BIO SURGEON STRL SZ7.5 (GLOVE) ×4 IMPLANT
GLOVE BIOGEL PI IND STRL 7.5 (GLOVE) ×2 IMPLANT
GLOVE BIOGEL PI INDICATOR 7.5 (GLOVE) ×2
GLOVE SURG SS PI 7.5 STRL IVOR (GLOVE) ×4 IMPLANT
GOWN STRL REUS W/ TWL LRG LVL3 (GOWN DISPOSABLE) ×2 IMPLANT
GOWN STRL REUS W/TWL 2XL LVL3 (GOWN DISPOSABLE) ×2 IMPLANT
GOWN STRL REUS W/TWL LRG LVL3 (GOWN DISPOSABLE) ×2
HEMOSTAT ARISTA ABSORB 3G PWDR (HEMOSTASIS) ×2 IMPLANT
HEMOSTAT SNOW SURGICEL 2X4 (HEMOSTASIS) ×2 IMPLANT
KIT BASIN OR (CUSTOM PROCEDURE TRAY) ×2 IMPLANT
KIT TURNOVER KIT B (KITS) ×2 IMPLANT
NS IRRIG 1000ML POUR BTL (IV SOLUTION) ×2 IMPLANT
PAD ARMBOARD 7.5X6 YLW CONV (MISCELLANEOUS) ×6 IMPLANT
POUCH RETRIEVAL ECOSAC 10 (ENDOMECHANICALS) IMPLANT
POUCH RETRIEVAL ECOSAC 10MM (ENDOMECHANICALS)
POUCH SPECIMEN RETRIEVAL 10MM (ENDOMECHANICALS) IMPLANT
SCISSORS LAP 5X35 DISP (ENDOMECHANICALS) ×2 IMPLANT
SET CHOLANGIOGRAPH 5 50 .035 (SET/KITS/TRAYS/PACK) ×2 IMPLANT
SET IRRIG TUBING LAPAROSCOPIC (IRRIGATION / IRRIGATOR) ×2 IMPLANT
SET TUBE SMOKE EVAC HIGH FLOW (TUBING) ×2 IMPLANT
SLEEVE ENDOPATH XCEL 5M (ENDOMECHANICALS) ×2 IMPLANT
SPECIMEN JAR SMALL (MISCELLANEOUS) ×2 IMPLANT
SPONGE GAUZE 2X2 STER 10/PKG (GAUZE/BANDAGES/DRESSINGS) ×1
STRIP CLOSURE SKIN 1/2X4 (GAUZE/BANDAGES/DRESSINGS) ×2 IMPLANT
SUT MNCRL AB 4-0 PS2 18 (SUTURE) ×2 IMPLANT
TOWEL GREEN STERILE (TOWEL DISPOSABLE) ×2 IMPLANT
TRAY LAPAROSCOPIC MC (CUSTOM PROCEDURE TRAY) ×2 IMPLANT
TROCAR XCEL BLUNT TIP 100MML (ENDOMECHANICALS) ×2 IMPLANT
TROCAR XCEL NON-BLD 11X100MML (ENDOMECHANICALS) ×2 IMPLANT
TROCAR XCEL NON-BLD 5MMX100MML (ENDOMECHANICALS) ×2 IMPLANT
WATER STERILE IRR 1000ML POUR (IV SOLUTION) ×2 IMPLANT

## 2018-11-15 NOTE — Op Note (Signed)
Laparoscopic Cholecystectomy with IOC Procedure Note  Indications: This patient presents with symptomatic gallbladder disease and will undergo laparoscopic cholecystectomy.  Pre-operative Diagnosis: Calculus of gallbladder with other cholecystitis, without mention of obstruction  Post-operative Diagnosis: Same  Surgeon: Maia Petties   Assistants: Sharyn Dross RNFA  Anesthesia: General endotracheal anesthesia  ASA Class: 2  Procedure Details  The patient was seen again in the Holding Room. The risks, benefits, complications, treatment options, and expected outcomes were discussed with the patient. The possibilities of reaction to medication, pulmonary aspiration, perforation of viscus, bleeding, recurrent infection, finding a normal gallbladder, the need for additional procedures, failure to diagnose a condition, the possible need to convert to an open procedure, and creating a complication requiring transfusion or operation were discussed with the patient. The likelihood of improving the patient's symptoms with return to their baseline status is good.  The patient and/or family concurred with the proposed plan, giving informed consent. The site of surgery properly noted. The patient was taken to Operating Room, identified as Carol Simon and the procedure verified as Laparoscopic Cholecystectomy with Intraoperative Cholangiogram. A Time Out was held and the above information confirmed.  Prior to the induction of general anesthesia, antibiotic prophylaxis was administered. General endotracheal anesthesia was then administered and tolerated well. After the induction, the abdomen was prepped with Chloraprep and draped in the sterile fashion. The patient was positioned in the supine position.  Local anesthetic agent was injected into the skin near the umbilicus and an incision made. We dissected down to the abdominal fascia with blunt dissection.  The fascia was incised vertically and we entered  the peritoneal cavity bluntly.  A pursestring suture of 0-Vicryl was placed around the fascial opening.  The Hasson cannula was inserted and secured with the stay suture.  Pneumoperitoneum was then created with CO2 and tolerated well without any adverse changes in the patient's vital signs. An 11-mm port was placed in the subxiphoid position.  Two 5-mm ports were placed in the right upper quadrant. All skin incisions were infiltrated with a local anesthetic agent before making the incision and placing the trocars.   We positioned the patient in reverse Trendelenburg, tilted slightly to the patient's left.  The gallbladder was identified, the fundus grasped and retracted cephalad. The gallbladder is contracted, thickened and intrahepatic.  Adhesions were lysed bluntly and with the electrocautery where indicated, taking care not to injure any adjacent organs or viscus. The infundibulum was grasped and retracted laterally, exposing the peritoneum overlying the triangle of Calot. This was then divided and exposed in a blunt fashion. A critical view of the cystic duct and cystic artery was obtained.  The cystic duct was clearly identified and bluntly dissected circumferentially. The cystic duct was ligated with a clip distally.   An incision was made in the cystic duct and the [facility] cholangiogram catheter introduced. The catheter was secured using a clip. A cholangiogram was then obtained which showed good visualization of the distal and proximal biliary tree with no sign of filling defects or obstruction.  Contrast flowed easily into the duodenum. The catheter was then removed.   The cystic duct was then ligated with clips and divided. The cystic artery was identified, dissected free, ligated with clips and divided as well.   The gallbladder was dissected from the liver bed in retrograde fashion with the electrocautery. The gallbladder was removed and placed in an Endocatch sac. There is a large venous lake on the  wall of the gallbladder  that was bleeding.  We controlled this with irrigation, clips, cautery, and Arista powder.  No further bleeding was noted.The gallbladder and Endocatch sac were then removed through the umbilical port site.  The pursestring suture was used to close the umbilical fascia.    We again inspected the right upper quadrant for hemostasis.  Pneumoperitoneum was released as we removed the trocars.  4-0 Monocryl was used to close the skin.   Benzoin, steri-strips, and clean dressings were applied. The patient was then extubated and brought to the recovery room in stable condition. Instrument, sponge, and needle counts were correct at closure and at the conclusion of the case.   Findings: Cholecystitis with Cholelithiasis  Estimated Blood Loss: 150 ml         Drains: none         Specimens: Gallbladder           Complications: None; patient tolerated the procedure well.         Disposition: PACU - hemodynamically stable.         Condition: stable  Imogene Burn. Georgette Dover, MD, [facility] Surgery  General/ Trauma Surgery Beeper [phone]  11/15/2018 9:15 AM

## 2018-11-15 NOTE — Anesthesia Postprocedure Evaluation (Signed)
Anesthesia Post Note  Patient: Carol Simon  Procedure(s) Performed: LAPAROSCOPIC CHOLECYSTECTOMY WITH INTRAOPERATIVE CHOLANGIOGRAM (N/A Abdomen)     Patient location during evaluation: PACU Anesthesia Type: General Level of consciousness: awake and alert and oriented Pain management: pain level controlled Vital Signs Assessment: post-procedure vital signs reviewed and stable Respiratory status: spontaneous breathing, nonlabored ventilation and respiratory function stable Cardiovascular status: blood pressure returned to baseline Postop Assessment: no apparent nausea or vomiting Anesthetic complications: no    Last Vitals:  Vitals:   11/15/18 0954 11/15/18 1000  BP: 140/71   Pulse: 62 (!) 58  Resp: 12 (!) 9  Temp:    SpO2: 100% 100%    Last Pain:  Vitals:   11/15/18 0955  TempSrc:   PainSc: [place]

## 2018-11-15 NOTE — Discharge Instructions (Signed)
[facility] ______[facility] °LAPAROSCOPIC SURGERY: POST OP INSTRUCTIONS °Always review your discharge instruction sheet given to you by the facility where your surgery was performed. °IF YOU HAVE DISABILITY OR FAMILY LEAVE FORMS, YOU MUST BRING THEM TO THE OFFICE FOR PROCESSING.   °DO NOT GIVE THEM TO YOUR DOCTOR. ° °1. A prescription for pain medication may be given to you upon discharge.  Take your pain medication as prescribed, if needed.  If narcotic pain medicine is not needed, then you may take acetaminophen (Tylenol) or ibuprofen (Advil) as needed. °2. Take your usually prescribed medications unless otherwise directed. °3. If you need a refill on your pain medication, please contact your pharmacy.  They will contact our office to request authorization. Prescriptions will not be filled after 5pm or on week-ends. °4. You should follow a light diet the first few days after arrival home, such as soup and crackers, etc.  Be sure to include lots of fluids daily. °5. Most patients will experience some swelling and bruising in the area of the incisions.  Ice packs will help.  Swelling and bruising can take several days to resolve.  °6. It is common to experience some constipation if taking pain medication after surgery.  Increasing fluid intake and taking a stool softener (such as Colace) will usually help or prevent this problem from occurring.  A mild laxative (Milk of Magnesia or Miralax) should be taken according to package instructions if there are no bowel movements after 48 hours. °7. Unless discharge instructions indicate otherwise, you may remove your bandages 24-48 hours after surgery, and you may shower at that time.  You may have steri-strips (small skin tapes) in place directly over the incision.  These strips should be left on the skin for 7-10 days.  If your surgeon used skin glue on the incision, you may shower in 24 hours.  The glue will flake off over the next 2-3 weeks.  Any sutures or  staples will be removed at the office during your follow-up visit. °8. ACTIVITIES:  You may resume regular (light) daily activities beginning the next day--such as daily self-care, walking, climbing stairs--gradually increasing activities as tolerated.  You may have sexual intercourse when it is comfortable.  Refrain from any heavy lifting or straining until approved by your doctor. °a. You may drive when you are no longer taking prescription pain medication, you can comfortably wear a seatbelt, and you can safely maneuver your car and apply brakes. °b. RETURN TO WORK:  __________________________________________________________ °9. You should see your doctor in the office for a follow-up appointment approximately 2-3 weeks after your surgery.  Make sure that you call for this appointment within a day or two after you arrive home to insure a convenient appointment time. °10. OTHER INSTRUCTIONS: __________________________________________________________________________________________________________________________ __________________________________________________________________________________________________________________________ °WHEN TO CALL YOUR DOCTOR: °1. Fever over 101.0 °2. Inability to urinate °3. Continued bleeding from incision. °4. Increased pain, redness, or drainage from the incision. °5. Increasing abdominal pain ° °The clinic staff is available to answer your questions during regular business hours.  Please don’t hesitate to call and ask to speak to one of the nurses for clinical concerns.  If you have a medical emergency, go to the nearest emergency room or call 911.  A surgeon from [facility] is always on call at the hospital. °[address], [address], [place], [place]  [address] ? [address] [address], [place], [place]   [address] °[phone] ? [phone] ? FAX [phone] °Web site:   [email or web address] °

## 2018-11-15 NOTE — Transfer of Care (Signed)
Immediate Anesthesia Transfer of Care Note  Patient: Carol Simon  Procedure(s) Performed: LAPAROSCOPIC CHOLECYSTECTOMY WITH INTRAOPERATIVE CHOLANGIOGRAM (N/A Abdomen)  Patient Location: PACU  Anesthesia Type:General  Level of Consciousness: awake, oriented and patient cooperative  Airway & Oxygen Therapy: Patient Spontanous Breathing and Patient connected to nasal cannula oxygen  Post-op Assessment: Report given to RN and Post -op Vital signs reviewed and stable  Post vital signs: Reviewed and stable  Last Vitals:  Vitals Value Taken Time  BP    Temp    Pulse 81 11/15/18 0924  Resp 18 11/15/18 0924  SpO2 98 % 11/15/18 0924  Vitals shown include unvalidated device data.  Last Pain:  Vitals:   11/15/18 0609  TempSrc: Oral  PainSc:       Patients Stated Pain Goal: 3 ([id number])  Complications: No apparent anesthesia complications

## 2018-11-15 NOTE — Interval H&P Note (Signed)
History and Physical Interval Note:  11/15/2018 7:01 AM  Carol Simon  has presented today for surgery, with the diagnosis of Chronic calculus cholecystitis.  The various methods of treatment have been discussed with the patient and family. After consideration of risks, benefits and other options for treatment, the patient has consented to  Procedure(s): LAPAROSCOPIC CHOLECYSTECTOMY WITH INTRAOPERATIVE CHOLANGIOGRAM (N/A) as a surgical intervention.  The patient's history has been reviewed, patient examined, no change in status, stable for surgery.  I have reviewed the patient's chart and labs.  Questions were answered to the patient's satisfaction.     Maia Petties

## 2018-11-15 NOTE — Anesthesia Procedure Notes (Signed)
Procedure Name: Intubation Date/Time: 11/15/2018 7:34 AM Performed by: Orlie Dakin, CRNA Pre-anesthesia Checklist: Patient identified, Emergency Drugs available, Suction available and Patient being monitored Patient Re-evaluated:Patient Re-evaluated prior to induction Oxygen Delivery Method: Circle system utilized Preoxygenation: Pre-oxygenation with 100% oxygen Induction Type: IV induction Ventilation: Mask ventilation without difficulty Laryngoscope Size: Miller and 3 Grade View: Grade I Tube type: Oral Tube size: 7.0 mm Number of attempts: 1 Airway Equipment and Method: Stylet Placement Confirmation: ETT inserted through vocal cords under direct vision,  positive ETCO2 and breath sounds checked- equal and bilateral Secured at: 22 cm Tube secured with: Tape Dental Injury: Teeth and Oropharynx as per pre-operative assessment  Comments: 4x4s bite block used.

## 2018-11-15 NOTE — Addendum Note (Signed)
Addendum  created 11/15/18 1035 by Orlie Dakin, CRNA   Intraprocedure Flowsheets edited

## 2018-11-16 ENCOUNTER — Encounter (HOSPITAL_COMMUNITY): Payer: Self-pay | Admitting: Surgery

## 2018-12-25 ENCOUNTER — Other Ambulatory Visit: Payer: Self-pay | Admitting: Internal Medicine

## 2018-12-25 DIAGNOSIS — E785 Hyperlipidemia, unspecified: Secondary | ICD-10-CM

## 2019-09-04 ENCOUNTER — Other Ambulatory Visit: Payer: Self-pay | Admitting: Internal Medicine

## 2019-09-04 DIAGNOSIS — D3500 Benign neoplasm of unspecified adrenal gland: Secondary | ICD-10-CM

## 2019-09-09 ENCOUNTER — Ambulatory Visit
Admission: RE | Admit: 2019-09-09 | Discharge: 2019-09-09 | Disposition: A | Discharge status: Home / Self Care | Payer: 59 | Source: Ambulatory Visit | Attending: Internal Medicine | Admitting: Internal Medicine

## 2019-09-09 DIAGNOSIS — D3500 Benign neoplasm of unspecified adrenal gland: Secondary | ICD-10-CM

## 2019-09-09 IMAGING — CT CT ABD-PELV W/ CM
2 of 5 series · 12 of 46 positions shown, 14 images · IV contrast (iopamidol)
Comparison: 10/17/2018

CLINICAL DATA: Follow-up right adrenal mass.

EXAM:
CT ABDOMEN AND PELVIS WITH CONTRAST
TECHNIQUE: Multidetector CT imaging of the abdomen and pelvis was performed
using the standard protocol following bolus administration of
intravenous contrast.
CONTRAST:  100mL QLRAGE-XBB IOPAMIDOL (QLRAGE-XBB) INJECTION 61%

[Series 2: abd pelvis 5.00 br40 s3 axial · axial · 0.60mm/px · z∈[+1209,+1589]mm · 9 of 91 slices shown, 11 images]
[im 10/91  soft-tissue]
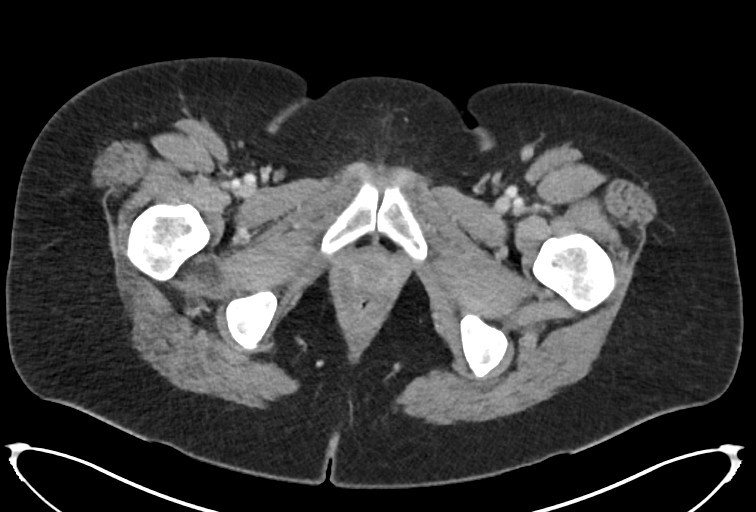
[im 10/91  bone]
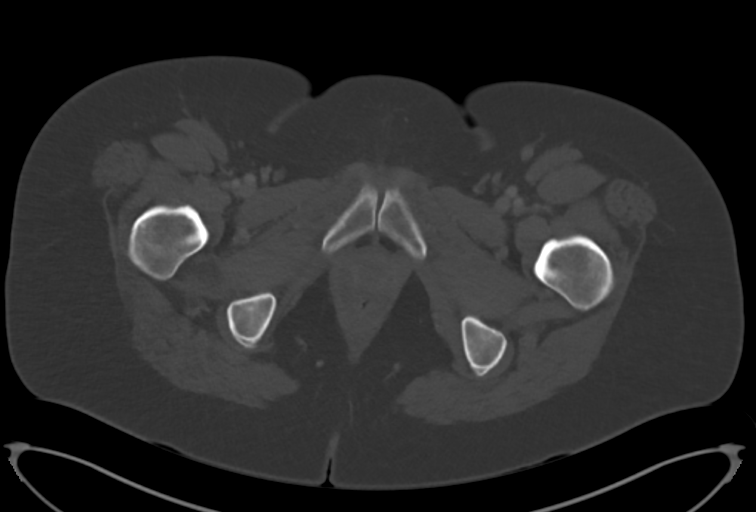
[im 19/91  soft-tissue]
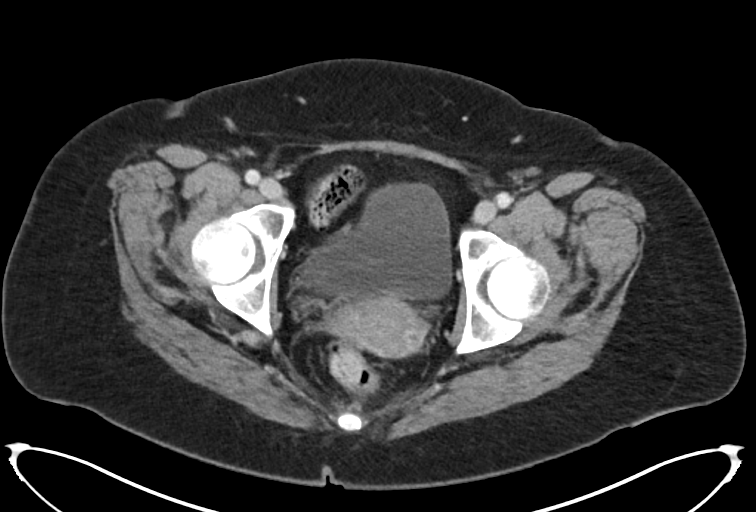
[im 29/91  soft-tissue]
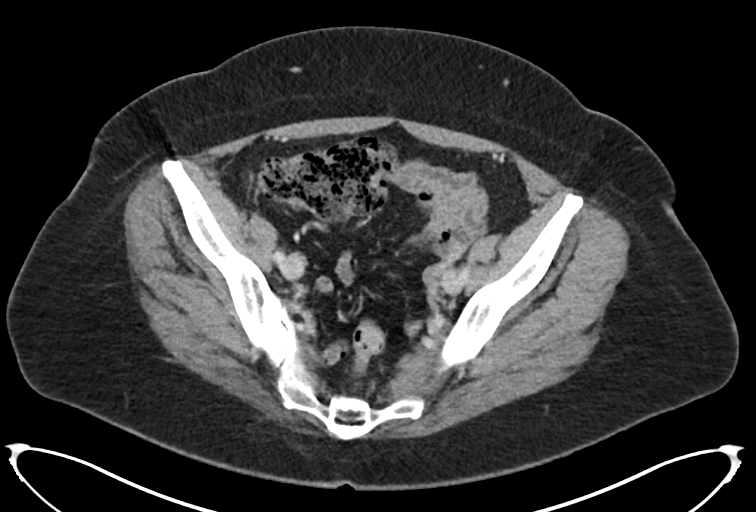
[im 38/91  soft-tissue]
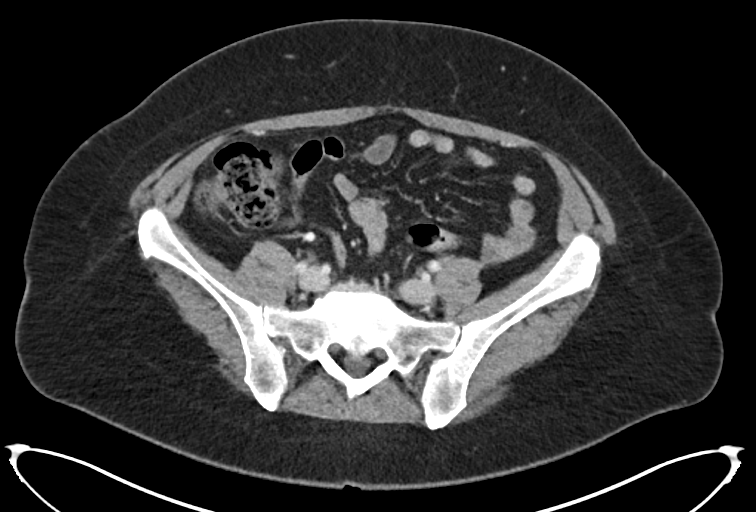
[im 48/91  soft-tissue]
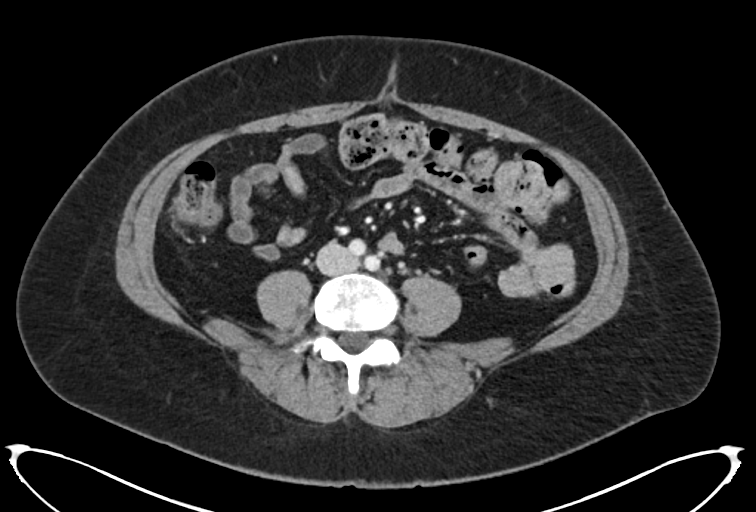
[im 57/91  soft-tissue]
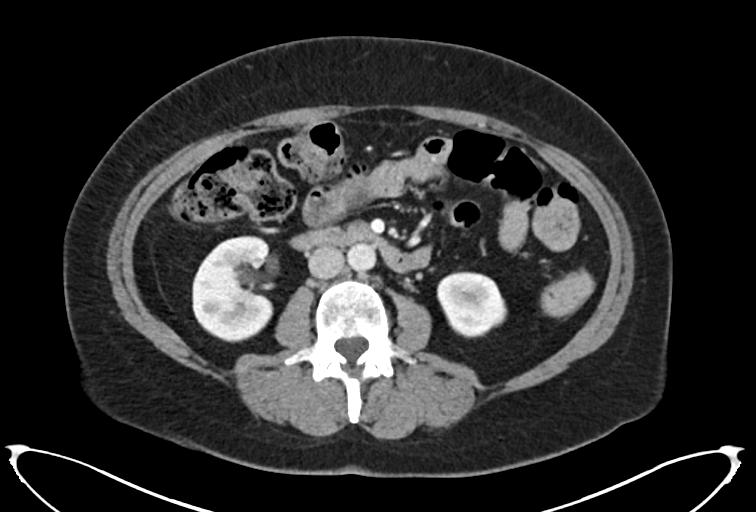
[im 67/91  soft-tissue]
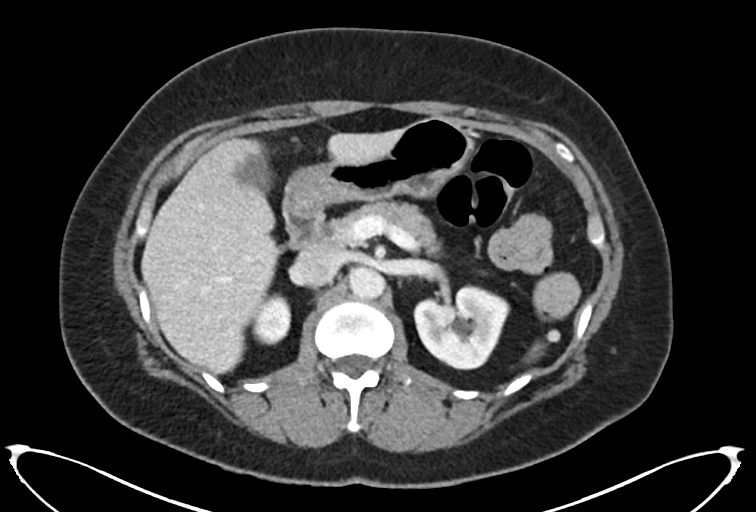
[im 76/91  soft-tissue]
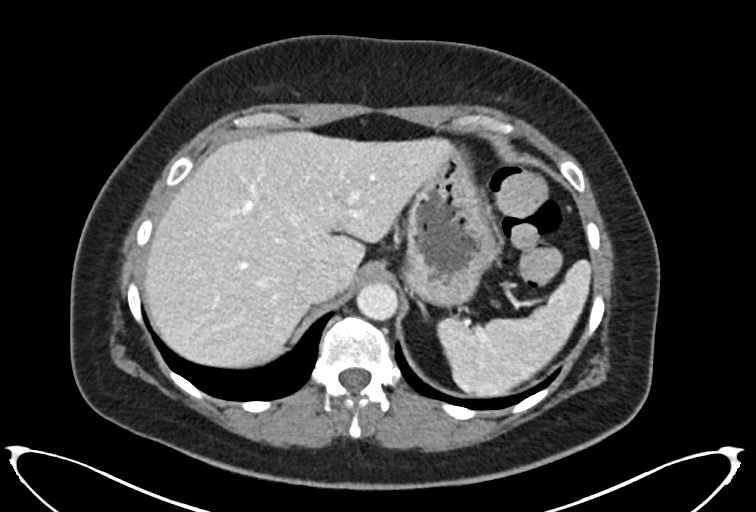
[im 86/91  soft-tissue]
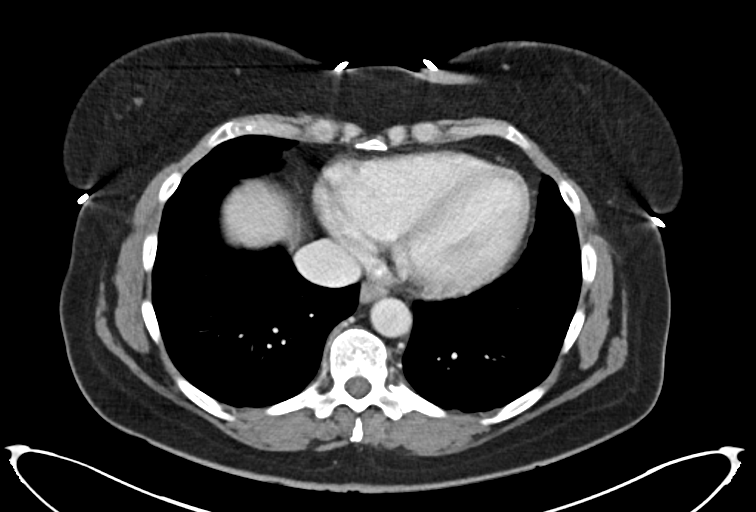
[im 86/91  bone]
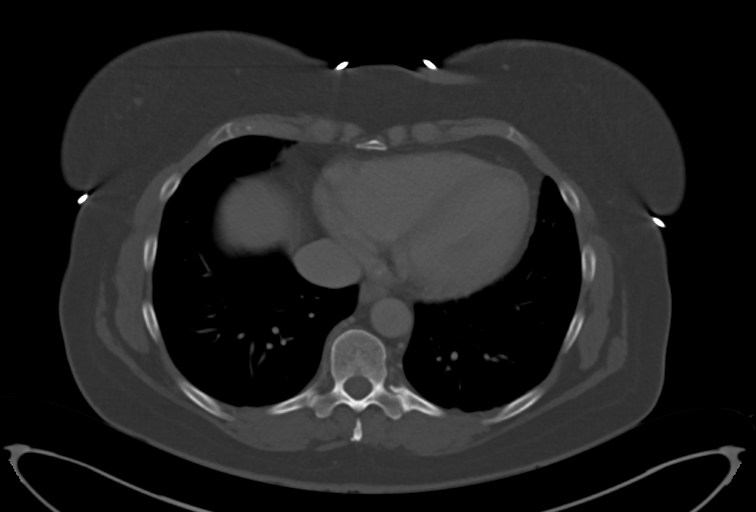

[Series 6: abd pelvis 2.00 br40 s3 cor · coronal · 0.88mm/px · 3 of 143 slices shown]
[im 48/143  soft-tissue]
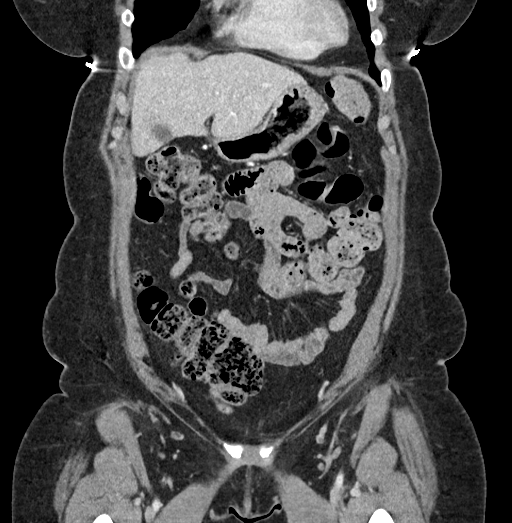
[im 64/143  soft-tissue]
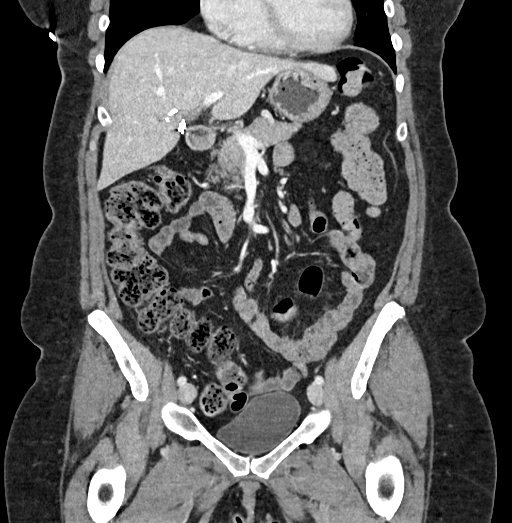
[im 79/143  soft-tissue]
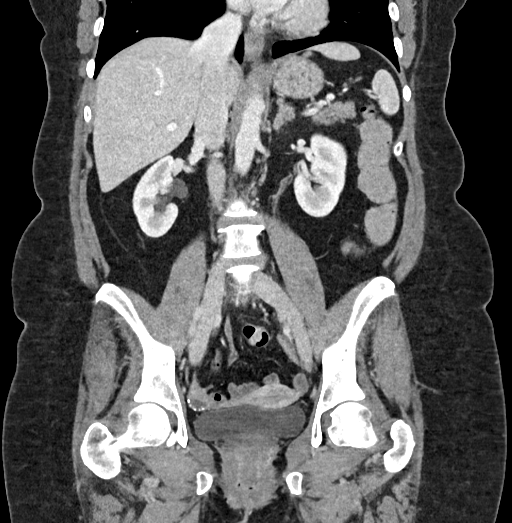

[12 of 46 positions shown; findings below may reference images not displayed]

FINDINGS: Lower Chest: No acute findings.

Hepatobiliary: No hepatic masses identified. Small cysts in lateral
segment of left lobe remains stable. Cholecystectomy has been
performed since previous study. A small postop fluid collection is
seen in the gallbladder fossa which measures 3.3 x 2.9 cm. No
evidence of biliary ductal dilatation.

Pancreas:  No mass or inflammatory changes.

Spleen: Within normal limits in size and appearance.

Adrenals/Urinary Tract: 1.2 cm right adrenal mass is stable and
consistent with a benign adenoma. The left adrenal gland and kidneys
are normal in appearance.

Stomach/Bowel: No evidence of obstruction, inflammatory process or
abnormal fluid collections.

Vascular/Lymphatic: No pathologically enlarged lymph nodes. No
abdominal aortic aneurysm.

Reproductive: Left unicornuate uterus again noted. Adnexal regions
are unremarkable in appearance.

Other:  None.

Musculoskeletal:  No suspicious bone lesions identified.
IMPRESSION: 1. No acute findings.
2. Stable small benign right adrenal adenoma.
3. Small postop fluid collection in gallbladder fossa.
4. Left unicornuate uterus.

## 2019-09-09 MED ORDER — IOPAMIDOL (ISOVUE-300) INJECTION 61%
100.0000 mL | Freq: Once | INTRAVENOUS | Status: AC | PRN
  Administered 2019-09-09: 100 mL via INTRAVENOUS

## 2021-02-16 ENCOUNTER — Other Ambulatory Visit: Payer: Self-pay | Admitting: Family Medicine

## 2021-02-16 DIAGNOSIS — N644 Mastodynia: Secondary | ICD-10-CM

## 2021-03-31 ENCOUNTER — Other Ambulatory Visit: Payer: Self-pay | Admitting: Family Medicine

## 2021-03-31 ENCOUNTER — Ambulatory Visit
Admission: RE | Admit: 2021-03-31 | Discharge: 2021-03-31 | Disposition: A | Discharge status: Home / Self Care | Payer: 59 | Source: Ambulatory Visit | Attending: Family Medicine | Admitting: Family Medicine

## 2021-03-31 DIAGNOSIS — N644 Mastodynia: Secondary | ICD-10-CM

## 2021-03-31 IMAGING — US US BREAST*L* LIMITED INC AXILLA
1 series · 2 of 2 positions shown · non-contrast
Comparison: Previous exam(s).

CLINICAL DATA: 60-year-old female presenting for evaluation of
focal left breast pain for several months.

EXAM:
DIGITAL DIAGNOSTIC UNILATERAL LEFT MAMMOGRAM WITH TOMOSYNTHESIS AND
CAD; ULTRASOUND LEFT BREAST LIMITED
TECHNIQUE: Left digital diagnostic mammography and breast tomosynthesis was
performed. The images were evaluated with computer-aided detection.;
Targeted ultrasound examination of the left breast was performed.

[Series 1: us breast*left* limited inc axilla · 0.13mm/px · 2 of 2 slices shown]
[im 1/2]
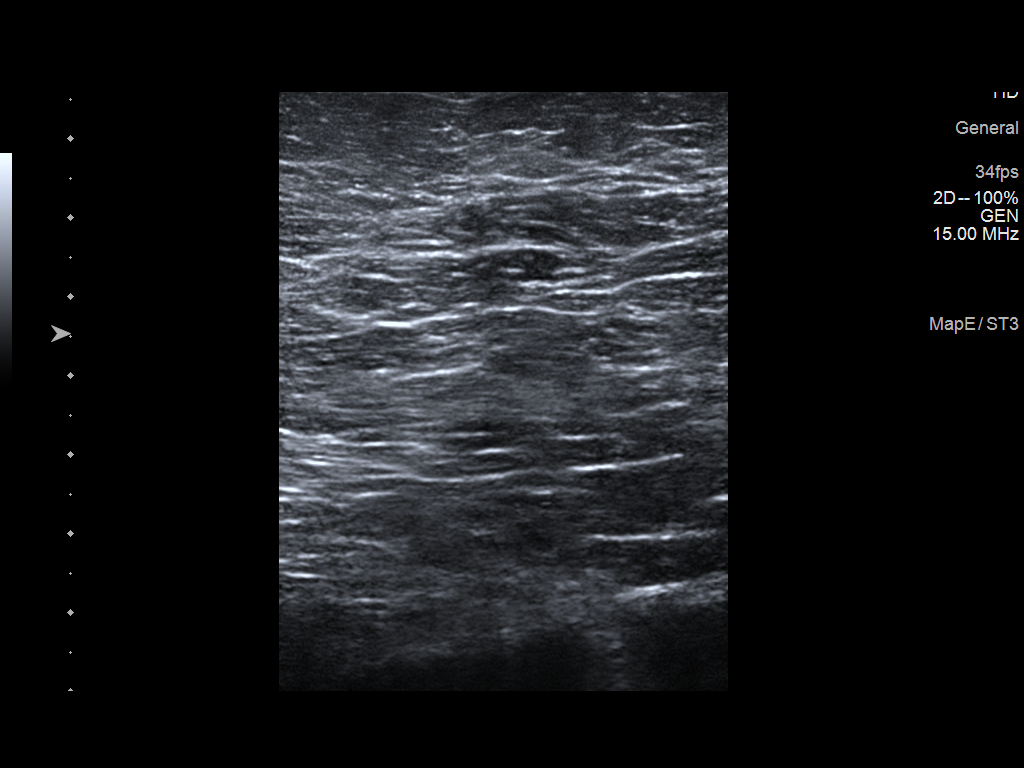
[im 2/2]
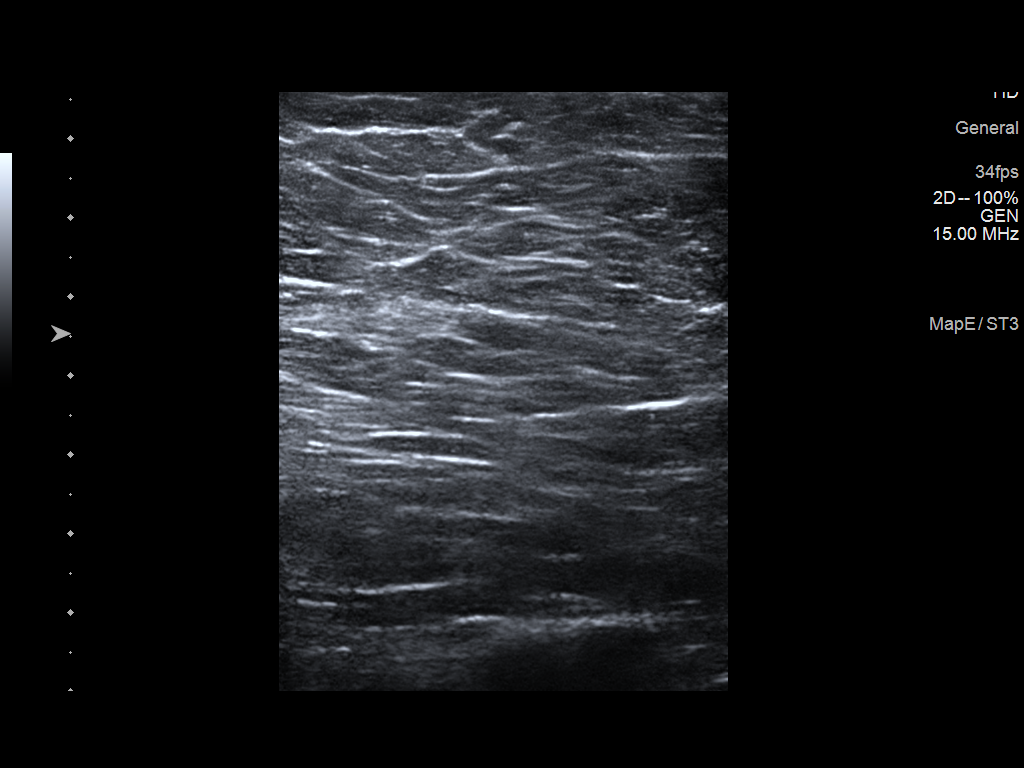

[2 of 2 positions shown; findings below may reference images not displayed]

ACR Breast Density Category c: The breast tissue is heterogeneously
dense, which may obscure small masses.
FINDINGS: Mammogram:

A skin BB marks the palpable site of concern reported by the patient
in the lower inner left breast. A spot tangential view of this area
was performed in addition to standard views. There is no new
abnormality at the palpable site or elsewhere in the left breast to
suggest the presence of malignancy.

On physical exam at the site of pain reported by the patient in the
left breast I do not feel a discrete mass or focal area of
thickening. There are no obvious skin changes.

Ultrasound:

Targeted ultrasound performed in the left breast at 6 o'clock 6 cm
from the nipple at the site of pain reported by the patient
demonstrating no cystic or solid mass. No focal fluid collection.
IMPRESSION: No mammographic or sonographic evidence of malignancy at the site of
pain reported by the patient in the inferior left breast.

RECOMMENDATION:
1. Clinical follow-up as needed for the left breast pain. Breast
pain is a common condition, which will often resolve on its own
without intervention. It can be affected by hormonal changes,
medication side effect, weight changes and fit of the bra. Pain may
also be referred from other adjacent areas of the body. Breast pain
may be improved by wearing adequate well-fitting support,
over-the-counter topical and oral NSAID medication, low-fat diet,
and ice/heat as needed. Studies have shown an improvement in cyclic
pain with use of evening primrose oil and vitamin E.

2. Return for routine annual screening mammography which will be due
in August 2021.

I have discussed the findings and recommendations with the patient.
If applicable, a reminder letter will be sent to the patient
regarding the next appointment.

BI-RADS CATEGORY  1: Negative.

## 2021-03-31 IMAGING — MG MM DIGITAL DIAGNOSTIC UNILAT*L* W/ TOMO W/ CAD
6 series · 6 of 18 positions shown · non-contrast
Comparison: Previous exam(s).

CLINICAL DATA: 60-year-old female presenting for evaluation of
focal left breast pain for several months.

EXAM:
DIGITAL DIAGNOSTIC UNILATERAL LEFT MAMMOGRAM WITH TOMOSYNTHESIS AND
CAD; ULTRASOUND LEFT BREAST LIMITED
TECHNIQUE: Left digital diagnostic mammography and breast tomosynthesis was
performed. The images were evaluated with computer-aided detection.;
Targeted ultrasound examination of the left breast was performed.

[L CC synth-2D]
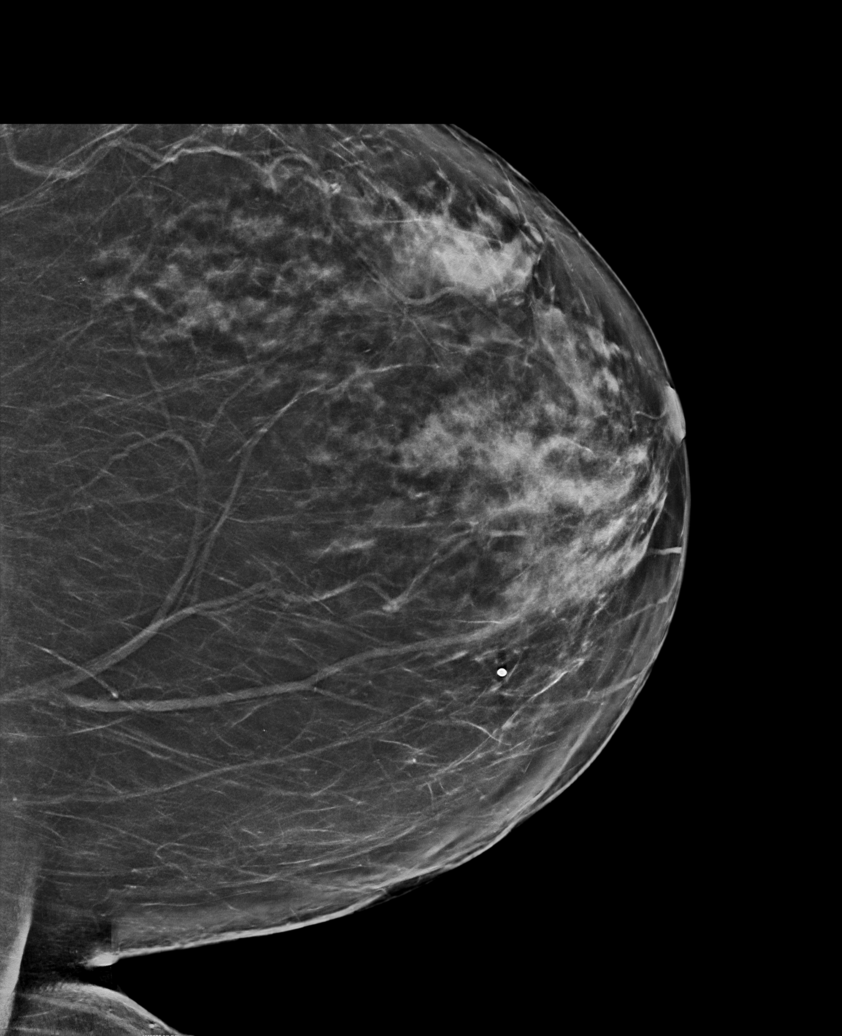

[L MLO synth-2D (1 of 2)]
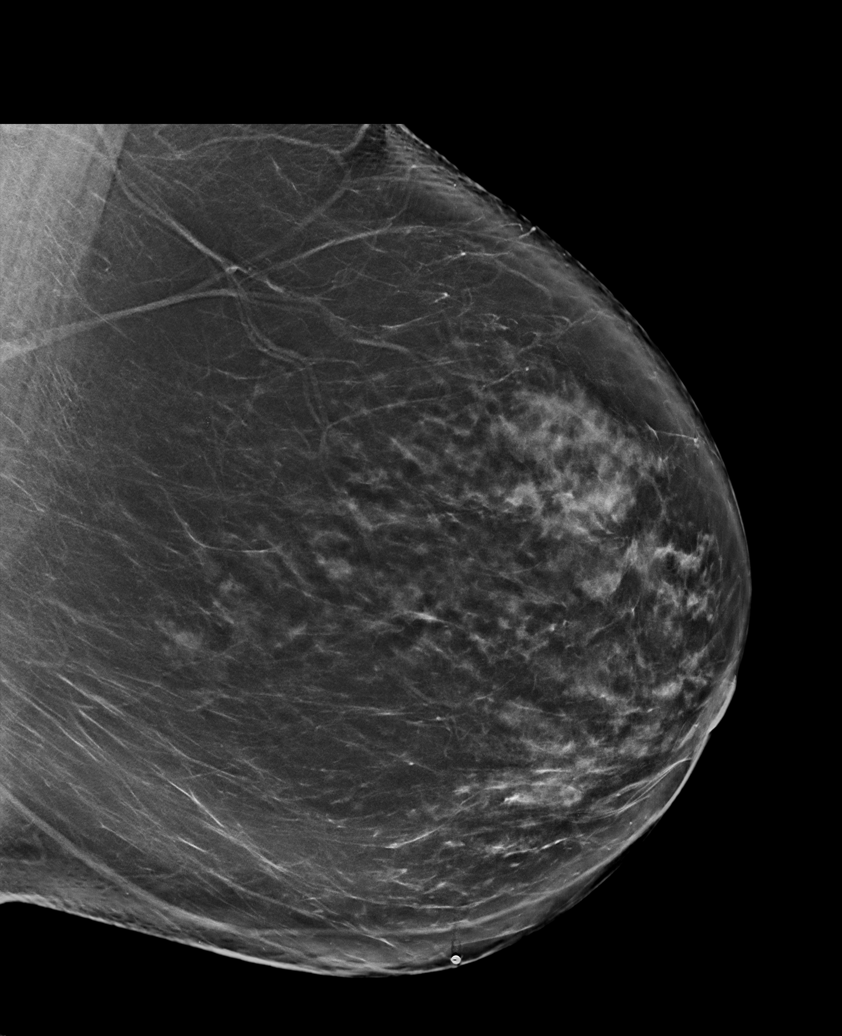

[L MLO synth-2D (2 of 2)]
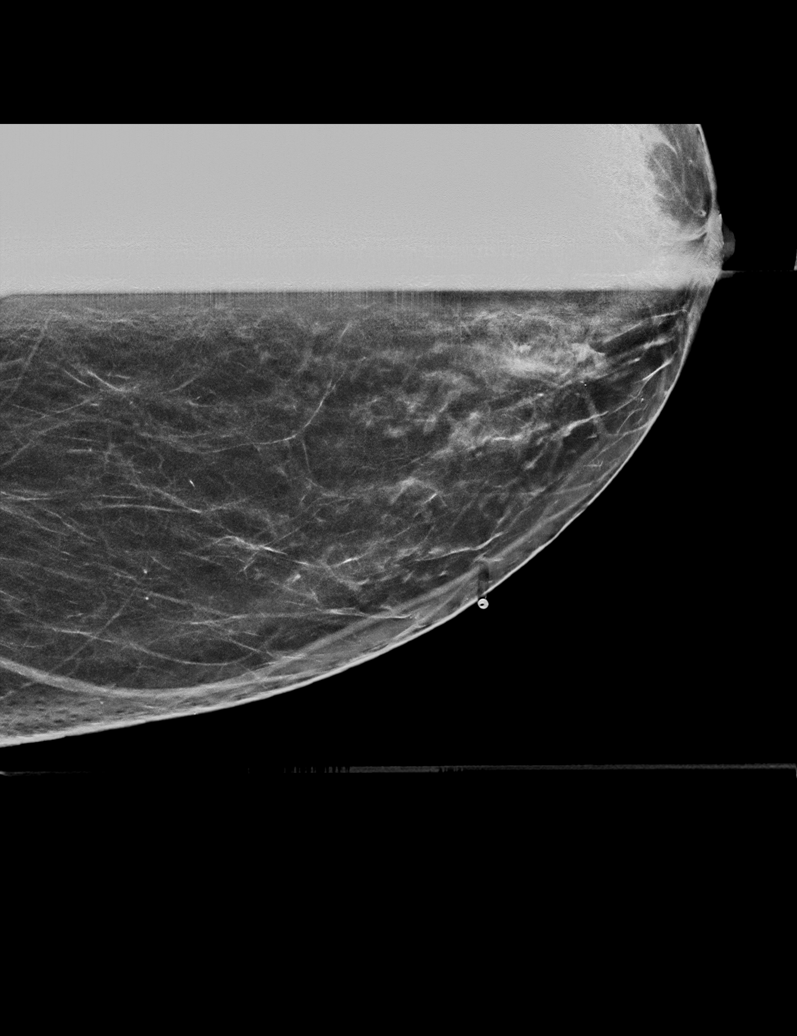

[L MLO tomo (1 of 2) · tomo slice 29/58.0]
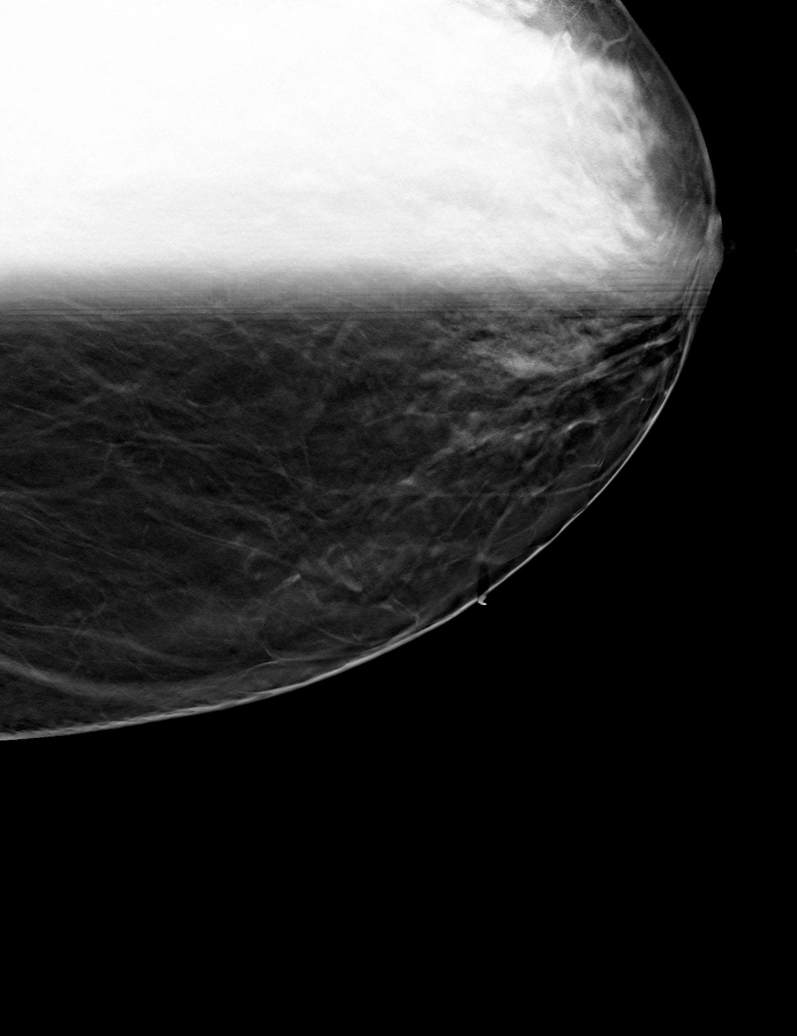

[L CC tomo · tomo slice 43/84.0]
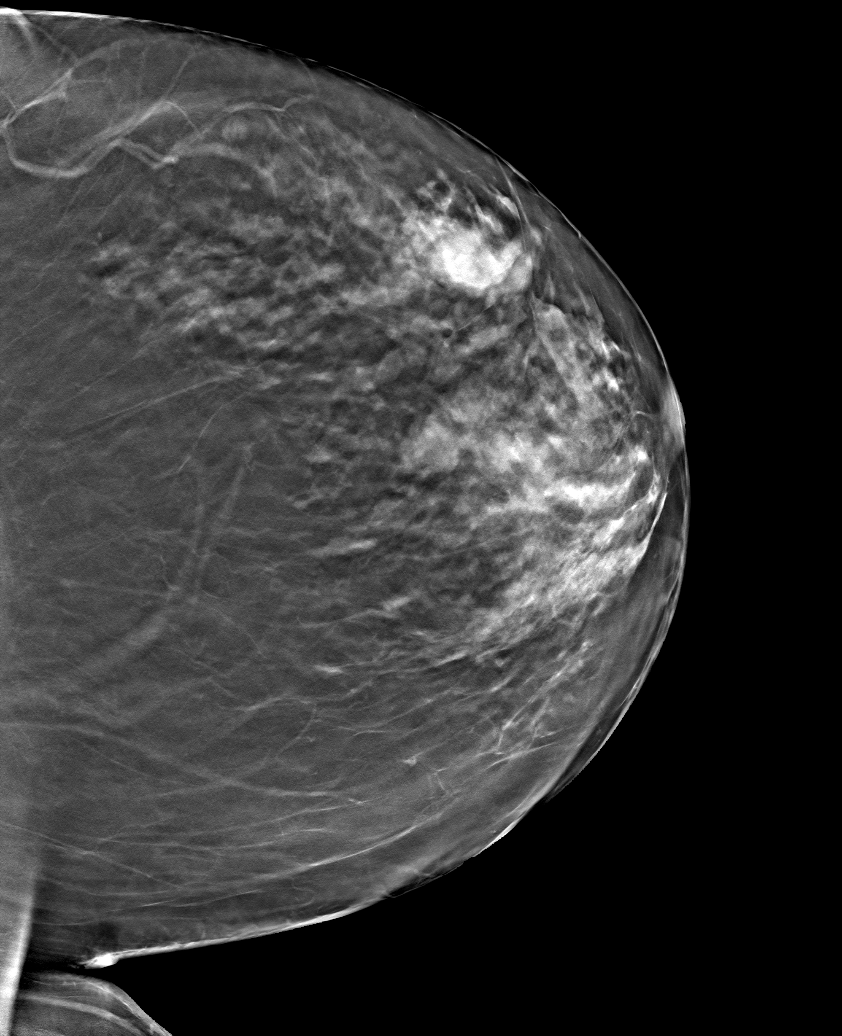

[L MLO tomo (2 of 2) · tomo slice 49/97.0]
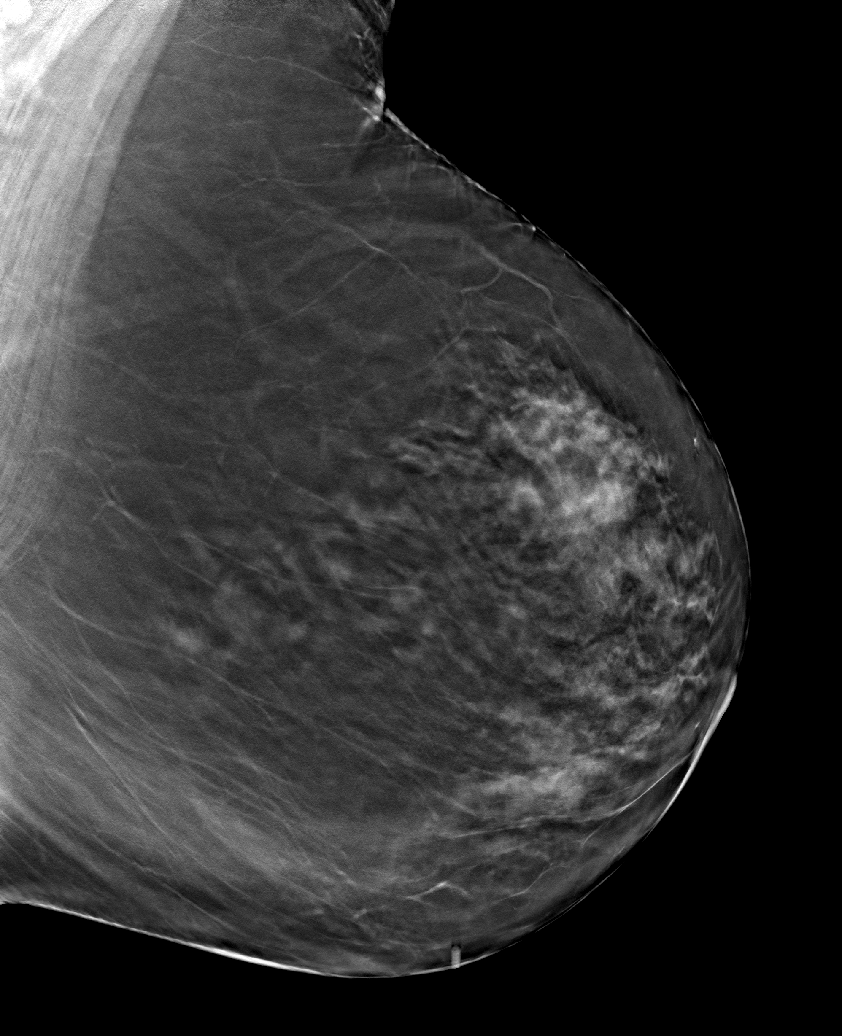

[6 of 18 positions shown; findings below may reference images not displayed]

ACR Breast Density Category c: The breast tissue is heterogeneously
dense, which may obscure small masses.
FINDINGS: Mammogram:

A skin BB marks the palpable site of concern reported by the patient
in the lower inner left breast. A spot tangential view of this area
was performed in addition to standard views. There is no new
abnormality at the palpable site or elsewhere in the left breast to
suggest the presence of malignancy.

On physical exam at the site of pain reported by the patient in the
left breast I do not feel a discrete mass or focal area of
thickening. There are no obvious skin changes.

Ultrasound:

Targeted ultrasound performed in the left breast at 6 o'clock 6 cm
from the nipple at the site of pain reported by the patient
demonstrating no cystic or solid mass. No focal fluid collection.
IMPRESSION: No mammographic or sonographic evidence of malignancy at the site of
pain reported by the patient in the inferior left breast.

RECOMMENDATION:
1. Clinical follow-up as needed for the left breast pain. Breast
pain is a common condition, which will often resolve on its own
without intervention. It can be affected by hormonal changes,
medication side effect, weight changes and fit of the bra. Pain may
also be referred from other adjacent areas of the body. Breast pain
may be improved by wearing adequate well-fitting support,
over-the-counter topical and oral NSAID medication, low-fat diet,
and ice/heat as needed. Studies have shown an improvement in cyclic
pain with use of evening primrose oil and vitamin E.

2. Return for routine annual screening mammography which will be due
in August 2021.

I have discussed the findings and recommendations with the patient.
If applicable, a reminder letter will be sent to the patient
regarding the next appointment.

BI-RADS CATEGORY  1: Negative.

## 2021-10-04 ENCOUNTER — Other Ambulatory Visit: Payer: Self-pay | Admitting: Internal Medicine

## 2021-10-04 DIAGNOSIS — Z0000 Encounter for general adult medical examination without abnormal findings: Secondary | ICD-10-CM

## 2021-12-21 ENCOUNTER — Ambulatory Visit
Admission: RE | Admit: 2021-12-21 | Discharge: 2021-12-21 | Disposition: A | Discharge status: Home / Self Care | Payer: No Typology Code available for payment source | Source: Ambulatory Visit | Attending: Internal Medicine | Admitting: Internal Medicine

## 2021-12-21 DIAGNOSIS — Z0000 Encounter for general adult medical examination without abnormal findings: Secondary | ICD-10-CM

## 2022-07-29 ENCOUNTER — Ambulatory Visit
Admission: RE | Admit: 2022-07-29 | Discharge: 2022-07-29 | Disposition: A | Discharge status: Home / Self Care | Payer: 59 | Source: Ambulatory Visit | Attending: Nurse Practitioner | Admitting: Nurse Practitioner

## 2022-07-29 VITALS — BP 139/89 | HR 79 | Temp 98.9°F | Resp 15

## 2022-07-29 DIAGNOSIS — H938X1 Other specified disorders of right ear: Secondary | ICD-10-CM | POA: Diagnosis not present

## 2022-07-29 DIAGNOSIS — H9311 Tinnitus, right ear: Secondary | ICD-10-CM

## 2022-07-29 MED ORDER — AMOXICILLIN-POT CLAVULANATE 875-125 MG PO TABS: 1.0000 | ORAL_TABLET | Freq: Two times a day (BID) | ORAL | 0 refills | Status: AC

## 2022-07-29 NOTE — ED Provider Notes (Signed)
[facility]    CSN: [id number] Arrival date & time: 07/29/22  4098      History   Chief Complaint Chief Complaint  Patient presents with   Ear Fullness    Entered by patient    HPI Carol Simon is a 62 y.o. female.   HPI She is in today with a friend.  She reports that she has been having ringing in her ears.  She was provided recommendations from her primary care provider for allergy treatment after 1 week this was not effective.  She was then prescribed 3 days of prednisone.  She reports that she completed this on Sunday however symptoms persist.  She is having ringing in her right ear.  She has a history of hypertension but currently is not taking any medication.  She reports that she does not monitor blood pressure at home and denies any elevated blood pressures.  She denies any headaches or dizziness or change in her hearing.  She also reports that while outside a bug flew in her nose and she does not feel like the blood came out.  She is now having increased pressure on her right face.  She denies any fever, chills, nausea, vomiting.  She denies any shortness of breath or chest pains. History reviewed. No pertinent past medical history.  There are no problems to display for this patient.   Past Surgical History:  Procedure Laterality Date   BREAST SURGERY Left    fibroid removed   CHOLECYSTECTOMY N/A 11/15/2018   Procedure: LAPAROSCOPIC CHOLECYSTECTOMY WITH INTRAOPERATIVE CHOLANGIOGRAM;  Surgeon: Manus Rudd, MD;  Location: [facility] OR;  Service: General;  Laterality: N/A;   WISDOM TOOTH EXTRACTION      OB History   No obstetric history on file.      Home Medications    Prior to Admission medications   Medication Sig Start Date End Date Taking? Authorizing Provider  amoxicillin-clavulanate (AUGMENTIN) 875-125 MG tablet Take 1 tablet by mouth every 12 (twelve) hours. 07/29/22  Yes Barbette Merino, NP    Family History History reviewed. No pertinent family  history.  Social History Social History   Tobacco Use   Smoking status: Never   Smokeless tobacco: Never  Vaping Use   Vaping Use: Never used  Substance Use Topics   Alcohol use: Never   Drug use: Never     Allergies   Sulfa antibiotics   Review of Systems Review of Systems   Physical Exam Triage Vital Signs ED Triage Vitals  Enc Vitals Group     BP 07/29/22 0952 (!) 157/80     Pulse Rate 07/29/22 0952 79     Resp 07/29/22 0952 15     Temp 07/29/22 0952 98.9 F (37.2 C)     Temp Source 07/29/22 0952 Oral     SpO2 07/29/22 0952 95 %     Weight --      Height --      Head Circumference --      Peak Flow --      Pain Score 07/29/22 1004 0     Pain Loc --      Pain Edu? --      Excl. in GC? --    No data found.  Updated Vital Signs BP 139/89 (BP Location: Right Arm)   Pulse 79   Temp 98.9 F (37.2 C) (Oral)   Resp 15   SpO2 95%   Visual Acuity Right Eye Distance:   Left  Eye Distance:   Bilateral Distance:    Right Eye Near:   Left Eye Near:    Bilateral Near:     Physical Exam Constitutional:      General: She is not in acute distress.    Appearance: She is obese.  HENT:     Head: Normocephalic and atraumatic.     Right Ear: Tympanic membrane normal. There is no impacted cerumen.     Left Ear: Tympanic membrane normal. There is no impacted cerumen.     Nose: Nose normal. No congestion or rhinorrhea.     Mouth/Throat:     Mouth: Mucous membranes are moist.  Eyes:     Pupils: Pupils are equal, round, and reactive to light.  Neck:     Vascular: No carotid bruit.  Cardiovascular:     Rate and Rhythm: Normal rate.  Pulmonary:     Effort: Pulmonary effort is normal.  Musculoskeletal:        General: Normal range of motion.     Cervical back: Normal range of motion. No rigidity or tenderness.  Lymphadenopathy:     Cervical: No cervical adenopathy.  Skin:    General: Skin is warm and dry.     Capillary Refill: Capillary refill takes less  than 2 seconds.  Neurological:     Mental Status: She is alert and oriented to person, place, and time.      UC Treatments / Results  Labs (all labs ordered are listed, but only abnormal results are displayed) Labs Reviewed - No data to display  EKG   Radiology No results found.  Procedures Procedures (including critical care time)  Medications Ordered in UC Medications - No data to display  Initial Impression / Assessment and Plan / UC Course  I have reviewed the triage vital signs and the nursing notes.  Pertinent labs & imaging results that were available during my care of the patient were reviewed by me and considered in my medical decision making (see chart for details).     Ear fullness Final Clinical Impressions(s) / UC Diagnoses   Final diagnoses:  Ear fullness, right  Tinnitus aurium, right  Will trial short course of Augmentin for 7 days to rule out any possible infection that may be causing her symptoms to worsen.   Discharge Instructions      You are currently being treated for possible ear infection with some sinusitis related to exposure to unknown insect.  You have been prescribed Augmentin 875/125 mg 1 tablet every 12 hours for 7 days.  Is encouraged please take with food.  If symptoms persist or get worse encouraged to follow back up with primary care provider or urgent care.     ED Prescriptions     Medication Sig Dispense Auth. Provider   amoxicillin-clavulanate (AUGMENTIN) 875-125 MG tablet Take 1 tablet by mouth every 12 (twelve) hours. 14 tablet Barbette Merino, NP      PDMP not reviewed this encounter.   Thad Ranger [place], [place] 07/29/22 1041

## 2022-07-29 NOTE — Discharge Instructions (Signed)
You are currently being treated for possible ear infection with some sinusitis related to exposure to unknown insect.  You have been prescribed Augmentin 875/125 mg 1 tablet every 12 hours for 7 days.  Is encouraged please take with food.  If symptoms persist or get worse encouraged to follow back up with primary care provider or urgent care.

## 2022-07-29 NOTE — ED Triage Notes (Signed)
Patient presents to UC for right ear tinnitus since 04/27.  Seen at PCP 04/28. PCP instructed her to take allegra, Flonase, and prednisone. States last dose of prednisone was Sunday. States no improvement. Some days worse than others. Has noted some inner ear irritation as well. Has ENT appt end of next month. Taking tylenol sinus as of right now. She also reports she had a bug stuck in right nostril. States she did not remove it. Unsure if the cause of her symptoms.

## 2022-09-03 ENCOUNTER — Other Ambulatory Visit: Payer: Self-pay | Admitting: Physician Assistant

## 2022-09-03 DIAGNOSIS — H9311 Tinnitus, right ear: Secondary | ICD-10-CM

## 2022-09-03 DIAGNOSIS — H903 Sensorineural hearing loss, bilateral: Secondary | ICD-10-CM

## 2022-09-14 ENCOUNTER — Ambulatory Visit
Admission: RE | Admit: 2022-09-14 | Discharge: 2022-09-14 | Disposition: A | Discharge status: Home / Self Care | Payer: 59 | Source: Ambulatory Visit | Attending: Physician Assistant | Admitting: Physician Assistant

## 2022-09-14 DIAGNOSIS — H9311 Tinnitus, right ear: Secondary | ICD-10-CM

## 2022-09-14 DIAGNOSIS — H903 Sensorineural hearing loss, bilateral: Secondary | ICD-10-CM

## 2022-09-14 MED ORDER — GADOPICLENOL 0.5 MMOL/ML IV SOLN
10.0000 mL | Freq: Once | INTRAVENOUS | Status: AC | PRN
  Administered 2022-09-14: 10 mL via INTRAVENOUS

## 2022-09-26 ENCOUNTER — Other Ambulatory Visit: Payer: 59

## 2022-12-14 DIAGNOSIS — H903 Sensorineural hearing loss, bilateral: Secondary | ICD-10-CM | POA: Diagnosis not present

## 2023-07-25 DIAGNOSIS — D1801 Hemangioma of skin and subcutaneous tissue: Secondary | ICD-10-CM | POA: Diagnosis not present

## 2023-07-25 DIAGNOSIS — Z85828 Personal history of other malignant neoplasm of skin: Secondary | ICD-10-CM | POA: Diagnosis not present

## 2023-07-25 DIAGNOSIS — Z129 Encounter for screening for malignant neoplasm, site unspecified: Secondary | ICD-10-CM | POA: Diagnosis not present

## 2023-07-25 DIAGNOSIS — L821 Other seborrheic keratosis: Secondary | ICD-10-CM | POA: Diagnosis not present

## 2023-09-21 DIAGNOSIS — Z1239 Encounter for other screening for malignant neoplasm of breast: Secondary | ICD-10-CM | POA: Diagnosis not present

## 2023-10-17 DIAGNOSIS — E785 Hyperlipidemia, unspecified: Secondary | ICD-10-CM | POA: Diagnosis not present

## 2023-10-24 DIAGNOSIS — Z0000 Encounter for general adult medical examination without abnormal findings: Secondary | ICD-10-CM | POA: Diagnosis not present

## 2023-10-24 DIAGNOSIS — R030 Elevated blood-pressure reading, without diagnosis of hypertension: Secondary | ICD-10-CM | POA: Diagnosis not present

## 2023-10-24 DIAGNOSIS — R82998 Other abnormal findings in urine: Secondary | ICD-10-CM | POA: Diagnosis not present

## 2023-10-24 DIAGNOSIS — Z1331 Encounter for screening for depression: Secondary | ICD-10-CM | POA: Diagnosis not present

## 2023-10-24 DIAGNOSIS — Z1389 Encounter for screening for other disorder: Secondary | ICD-10-CM | POA: Diagnosis not present
